# Patient Record
Sex: Female | Born: 1950
Health system: Southern US, Community
[De-identification: ages and names within clinical notes are randomized; demographics above are authoritative.]

## PROBLEM LIST (undated history)

## (undated) DIAGNOSIS — E079 Disorder of thyroid, unspecified: Secondary | ICD-10-CM

## (undated) DIAGNOSIS — F17200 Nicotine dependence, unspecified, uncomplicated: Secondary | ICD-10-CM

## (undated) DIAGNOSIS — I10 Essential (primary) hypertension: Secondary | ICD-10-CM

## (undated) DIAGNOSIS — E781 Pure hyperglyceridemia: Secondary | ICD-10-CM

## (undated) DIAGNOSIS — M1990 Unspecified osteoarthritis, unspecified site: Secondary | ICD-10-CM

## (undated) DIAGNOSIS — R7301 Impaired fasting glucose: Secondary | ICD-10-CM

## (undated) DIAGNOSIS — F419 Anxiety disorder, unspecified: Secondary | ICD-10-CM

## (undated) DIAGNOSIS — E559 Vitamin D deficiency, unspecified: Secondary | ICD-10-CM

## (undated) DIAGNOSIS — E785 Hyperlipidemia, unspecified: Secondary | ICD-10-CM

## (undated) HISTORY — PX: TUBAL LIGATION: SHX77

## (undated) HISTORY — PX: CARPAL TUNNEL RELEASE: SHX101

## (undated) HISTORY — DX: Hyperlipidemia, unspecified: E78.5

## (undated) HISTORY — DX: Unspecified osteoarthritis, unspecified site: M19.90

## (undated) HISTORY — DX: Vitamin D deficiency, unspecified: E55.9

## (undated) HISTORY — DX: Pure hyperglyceridemia: E78.1

## (undated) HISTORY — DX: Essential (primary) hypertension: I10

## (undated) HISTORY — DX: Anxiety disorder, unspecified: F41.9

## (undated) HISTORY — DX: Impaired fasting glucose: R73.01

## (undated) HISTORY — DX: Disorder of thyroid, unspecified: E07.9

## (undated) HISTORY — DX: Nicotine dependence, unspecified, uncomplicated: F17.200

---

## 2012-02-22 ENCOUNTER — Ambulatory Visit: Payer: Self-pay

## 2012-02-22 LAB — HM PAP SMEAR

## 2012-08-30 ENCOUNTER — Ambulatory Visit: Payer: Self-pay

## 2012-08-30 LAB — URINALYSIS, COMPLETE
Bilirubin,UR: NEGATIVE
Glucose,UR: NEGATIVE mg/dL (ref 0–75)
Leukocyte Esterase: NEGATIVE
Nitrite: NEGATIVE

## 2014-05-10 ENCOUNTER — Ambulatory Visit: Payer: Self-pay | Admitting: Family Medicine

## 2015-05-18 DIAGNOSIS — E039 Hypothyroidism, unspecified: Secondary | ICD-10-CM

## 2015-05-18 DIAGNOSIS — E063 Autoimmune thyroiditis: Secondary | ICD-10-CM | POA: Insufficient documentation

## 2015-05-18 DIAGNOSIS — M1990 Unspecified osteoarthritis, unspecified site: Secondary | ICD-10-CM | POA: Insufficient documentation

## 2015-05-18 DIAGNOSIS — I10 Essential (primary) hypertension: Secondary | ICD-10-CM | POA: Insufficient documentation

## 2015-05-18 DIAGNOSIS — E559 Vitamin D deficiency, unspecified: Secondary | ICD-10-CM

## 2015-05-18 DIAGNOSIS — E781 Pure hyperglyceridemia: Secondary | ICD-10-CM

## 2015-05-18 DIAGNOSIS — F419 Anxiety disorder, unspecified: Secondary | ICD-10-CM

## 2015-05-18 DIAGNOSIS — M19049 Primary osteoarthritis, unspecified hand: Secondary | ICD-10-CM

## 2015-05-18 DIAGNOSIS — E785 Hyperlipidemia, unspecified: Secondary | ICD-10-CM | POA: Insufficient documentation

## 2015-05-18 DIAGNOSIS — R7301 Impaired fasting glucose: Secondary | ICD-10-CM | POA: Insufficient documentation

## 2015-05-18 DIAGNOSIS — F17219 Nicotine dependence, cigarettes, with unspecified nicotine-induced disorders: Secondary | ICD-10-CM | POA: Insufficient documentation

## 2015-05-18 DIAGNOSIS — Z9189 Other specified personal risk factors, not elsewhere classified: Secondary | ICD-10-CM | POA: Insufficient documentation

## 2015-05-18 DIAGNOSIS — F17200 Nicotine dependence, unspecified, uncomplicated: Secondary | ICD-10-CM

## 2015-05-27 ENCOUNTER — Encounter: Payer: Self-pay | Admitting: Unknown Physician Specialty

## 2015-05-27 ENCOUNTER — Ambulatory Visit (INDEPENDENT_AMBULATORY_CARE_PROVIDER_SITE_OTHER): Payer: PRIVATE HEALTH INSURANCE | Admitting: Unknown Physician Specialty

## 2015-05-27 VITALS — BP 132/83 | HR 71 | Temp 98.5°F | Ht <= 58 in | Wt 118.8 lb

## 2015-05-27 DIAGNOSIS — I10 Essential (primary) hypertension: Secondary | ICD-10-CM

## 2015-05-27 DIAGNOSIS — F17200 Nicotine dependence, unspecified, uncomplicated: Secondary | ICD-10-CM

## 2015-05-27 DIAGNOSIS — E785 Hyperlipidemia, unspecified: Secondary | ICD-10-CM | POA: Diagnosis not present

## 2015-05-27 DIAGNOSIS — E039 Hypothyroidism, unspecified: Secondary | ICD-10-CM

## 2015-05-27 DIAGNOSIS — Z0000 Encounter for general adult medical examination without abnormal findings: Secondary | ICD-10-CM

## 2015-05-27 DIAGNOSIS — R7301 Impaired fasting glucose: Secondary | ICD-10-CM

## 2015-05-27 LAB — MICROALBUMIN, URINE WAIVED
Creatinine, Urine Waived: 50 mg/dL (ref 10–300)
Microalb, Ur Waived: 10 mg/L (ref 0–19)
Microalb/Creat Ratio: <30 mg/g (ref <30)

## 2015-05-27 LAB — BAYER DCA HB A1C WAIVED: HB A1C (BAYER DCA - WAIVED): 5.9 % (ref <7.0)

## 2015-05-27 NOTE — Progress Notes (Signed)
BP 132/83 mmHg  Pulse 71  Temp(Src) 98.5 F (36.9 C)  Ht 4' 9.2" (1.453 m)  Wt 118 lb 12.8 oz (53.887 kg)  BMI 25.52 kg/m2  SpO2 95%  LMP  (LMP Unknown)   Subjective:    Patient ID: Savannah Whitaker, female    DOB: 05-07-1951, 64 y.o.   MRN: [id number]  HPI: Savannah CottaDonna Florence Recupero is a 64 y.o. female  Chief Complaint  Patient presents with  . Annual Exam   Hypertension This is a chronic problem. The problem is controlled. Pertinent negatives include no anxiety, blurred vision, chest pain, headaches, malaise/fatigue, neck pain, orthopnea, palpitations, peripheral edema, PND, shortness of breath or sweats. The current treatment provides significant improvement. There are no compliance problems.   Hyperlipidemia Chronicity: Not taking cholesterol medication as she didn't like the way it made her feel with soreness and leg pain.   Pertinent negatives include no chest pain, focal sensory loss, focal weakness, leg pain, myalgias or shortness of breath.    Relevant past medical, surgical, family and social history reviewed and updated as indicated. Interim medical history since our last visit reviewed. Allergies and medications reviewed and updated.  Review of Systems  Constitutional: Negative for malaise/fatigue.  Eyes: Negative for blurred vision.  Respiratory: Negative for shortness of breath.   Cardiovascular: Negative for chest pain, palpitations, orthopnea and PND.  Musculoskeletal: Negative for myalgias and neck pain.  Neurological: Negative for focal weakness and headaches.    Per HPI unless specifically indicated above     Objective:    BP 132/83 mmHg  Pulse 71  Temp(Src) 98.5 F (36.9 C)  Ht 4' 9.2" (1.453 m)  Wt 118 lb 12.8 oz (53.887 kg)  BMI 25.52 kg/m2  SpO2 95%  LMP  (LMP Unknown)  Wt Readings from Last 3 Encounters:  05/27/15 118 lb 12.8 oz (53.887 kg)  12/02/14 121 lb (54.885 kg)    Physical Exam  Constitutional: She is oriented to person,  place, and time. She appears well-developed and well-nourished.  HENT:  Head: Normocephalic and atraumatic.  Eyes: Pupils are equal, round, and reactive to light. Right eye exhibits no discharge. Left eye exhibits no discharge. No scleral icterus.  Neck: Normal range of motion. Neck supple. Carotid bruit is not present. No thyromegaly present.  Cardiovascular: Normal rate, regular rhythm and normal heart sounds.  Exam reveals no gallop and no friction rub.   No murmur heard. Pulmonary/Chest: Effort normal and breath sounds normal. No respiratory distress. She has no wheezes. She has no rales.  Abdominal: Soft. Bowel sounds are normal. There is no tenderness. There is no rebound.  Genitourinary: Vagina normal and uterus normal. No breast swelling, tenderness or discharge. There is no rash, tenderness or lesion on the right labia. There is no rash, tenderness or lesion on the left labia. Cervix exhibits no motion tenderness and no friability. Right adnexum displays no mass, no tenderness and no fullness. Left adnexum displays no mass, no tenderness and no fullness.  Musculoskeletal: Normal range of motion.  Lymphadenopathy:    She has no cervical adenopathy.  Neurological: She is alert and oriented to person, place, and time.  Skin: Skin is warm, dry and intact. No rash noted.  Psychiatric: She has a normal mood and affect. Her speech is normal and behavior is normal. Judgment and thought content normal. Cognition and memory are normal.    Results for orders placed or performed in visit on 05/18/15  HM PAP SMEAR  Result Value  Ref Range   HM Pap smear from PP       Assessment & Plan:   Problem List Items Addressed This Visit      Unprioritized   Hyperlipidemia - Primary   Relevant Orders   Lipid Panel w/o Chol/HDL Ratio   Hypothyroidism   Relevant Orders   TSH   Hypertension   Relevant Orders   Uric acid   Microalbumin, Urine Waived   IFG (impaired fasting glucose)   Relevant  Orders   Bayer DCA Hb A1c Waived   Tobacco use disorder    Other Visit Diagnoses    Routine general medical examination at a health care facility        Relevant Orders    Hepatitis C antibody    HIV antibody    Comprehensive metabolic panel    CBC with Differential/Platelet    IGP, Aptima HPV, rfx 16/18,45    TSH       Diagnosis stable.  Continue present meds.  Will talk about cholesterol after results come back.    Follow up plan: No Follow-up on file.

## 2015-05-28 LAB — LIPID PANEL W/O CHOL/HDL RATIO
Cholesterol, Total: 261 mg/dL — ABNORMAL HIGH (ref 100–199)
HDL: 32 mg/dL — ABNORMAL LOW (ref >39)
Triglycerides: 500 mg/dL — ABNORMAL HIGH (ref 0–149)

## 2015-05-28 LAB — COMPREHENSIVE METABOLIC PANEL
A/G RATIO: 1.6 (ref 1.1–2.5)
ALBUMIN: 4.2 g/dL (ref 3.6–4.8)
ALT: 13 IU/L (ref 0–32)
AST: 12 IU/L (ref 0–40)
Alkaline Phosphatase: 52 IU/L (ref 39–117)
BUN / CREAT RATIO: 23 (ref 11–26)
BUN: 14 mg/dL (ref 8–27)
Bilirubin Total: 0.4 mg/dL (ref 0.0–1.2)
CALCIUM: 9.3 mg/dL (ref 8.7–10.3)
CO2: 24 mmol/L (ref 18–29)
CREATININE: 0.61 mg/dL (ref 0.57–1.00)
Chloride: 101 mmol/L (ref 97–106)
GFR, EST AFRICAN AMERICAN: 112 mL/min/{1.73_m2} (ref >59)
GFR, EST NON AFRICAN AMERICAN: 97 mL/min/{1.73_m2} (ref >59)
GLOBULIN, TOTAL: 2.6 g/dL (ref 1.5–4.5)
Glucose: 92 mg/dL (ref 65–99)
POTASSIUM: 4.1 mmol/L (ref 3.5–5.2)
SODIUM: 141 mmol/L (ref 136–144)
TOTAL PROTEIN: 6.8 g/dL (ref 6.0–8.5)

## 2015-05-28 LAB — CBC WITH DIFFERENTIAL/PLATELET
Basophils Absolute: 0.1 10*3/uL (ref 0.0–0.2)
Basos: 1 %
EOS (ABSOLUTE): 0.3 10*3/uL (ref 0.0–0.4)
EOS: 3 %
Hematocrit: 38.6 % (ref 34.0–46.6)
Hemoglobin: 12.9 g/dL (ref 11.1–15.9)
Immature Grans (Abs): 0 10*3/uL (ref 0.0–0.1)
Immature Granulocytes: 0 %
LYMPHS ABS: 2.4 10*3/uL (ref 0.7–3.1)
Lymphs: 23 %
MCH: 30 pg (ref 26.6–33.0)
MCHC: 33.4 g/dL (ref 31.5–35.7)
MCV: 90 fL (ref 79–97)
MONOS ABS: 0.6 10*3/uL (ref 0.1–0.9)
Monocytes: 5 %
NEUTROS ABS: 7 10*3/uL (ref 1.4–7.0)
Neutrophils: 68 %
PLATELETS: 258 10*3/uL (ref 150–379)
RBC: 4.3 x10E6/uL (ref 3.77–5.28)
RDW: 14.3 % (ref 12.3–15.4)
WBC: 10.3 10*3/uL (ref 3.4–10.8)

## 2015-05-28 LAB — URIC ACID: URIC ACID: 5.3 mg/dL (ref 2.5–7.1)

## 2015-05-28 LAB — HIV ANTIBODY (ROUTINE TESTING W REFLEX): HIV Screen 4th Generation wRfx: NONREACTIVE

## 2015-05-28 LAB — HEPATITIS C ANTIBODY

## 2015-05-28 LAB — TSH: TSH: 0.362 u[IU]/mL — AB (ref 0.450–4.500)

## 2015-05-29 ENCOUNTER — Telehealth: Payer: Self-pay | Admitting: Unknown Physician Specialty

## 2015-05-29 ENCOUNTER — Encounter: Payer: Self-pay | Admitting: Unknown Physician Specialty

## 2015-05-29 LAB — IGP, APTIMA HPV, RFX 16/18,45: PAP Smear Comment: 0

## 2015-05-29 NOTE — Progress Notes (Signed)
Quick Note:  High Triglycerides. Patient notified by letter and phone call. Recheck in 3 months ______

## 2015-05-29 NOTE — Telephone Encounter (Signed)
Phone call to discuss elevated Triglycerides.  Letter sent.  Will recheck in 3 months

## 2015-07-08 ENCOUNTER — Emergency Department
Admission: EM | Admit: 2015-07-08 | Discharge: 2015-07-08 | Disposition: A | Discharge status: Home / Self Care | Payer: PRIVATE HEALTH INSURANCE | Attending: Emergency Medicine | Admitting: Emergency Medicine

## 2015-07-08 ENCOUNTER — Emergency Department: Payer: PRIVATE HEALTH INSURANCE

## 2015-07-08 DIAGNOSIS — R251 Tremor, unspecified: Secondary | ICD-10-CM | POA: Diagnosis not present

## 2015-07-08 DIAGNOSIS — R002 Palpitations: Secondary | ICD-10-CM | POA: Diagnosis present

## 2015-07-08 DIAGNOSIS — I10 Essential (primary) hypertension: Secondary | ICD-10-CM | POA: Diagnosis not present

## 2015-07-08 DIAGNOSIS — Z79899 Other long term (current) drug therapy: Secondary | ICD-10-CM | POA: Insufficient documentation

## 2015-07-08 DIAGNOSIS — F17210 Nicotine dependence, cigarettes, uncomplicated: Secondary | ICD-10-CM | POA: Diagnosis not present

## 2015-07-08 LAB — CBC
HCT: 37.2 % (ref 35.0–47.0)
Hemoglobin: 12.7 g/dL (ref 12.0–16.0)
MCH: 30.4 pg (ref 26.0–34.0)
MCHC: 34.1 g/dL (ref 32.0–36.0)
MCV: 89.2 fL (ref 80.0–100.0)
PLATELETS: 262 10*3/uL (ref 150–440)
RBC: 4.17 MIL/uL (ref 3.80–5.20)
RDW: 13.8 % (ref 11.5–14.5)
WBC: 11.9 10*3/uL — ABNORMAL HIGH (ref 3.6–11.0)

## 2015-07-08 LAB — BASIC METABOLIC PANEL
Anion gap: 7 (ref 5–15)
BUN: 11 mg/dL (ref 6–20)
CHLORIDE: 110 mmol/L (ref 101–111)
CO2: 24 mmol/L (ref 22–32)
CREATININE: 0.62 mg/dL (ref 0.44–1.00)
Calcium: 9.2 mg/dL (ref 8.9–10.3)
GFR calc Af Amer: >60 mL/min (ref >60)
GFR calc non Af Amer: >60 mL/min (ref >60)
Glucose, Bld: 112 mg/dL — ABNORMAL HIGH (ref 65–99)
Potassium: 3.6 mmol/L (ref 3.5–5.1)
SODIUM: 141 mmol/L (ref 135–145)

## 2015-07-08 LAB — TROPONIN I: Troponin I: <0.03 ng/mL (ref <0.031)

## 2015-07-08 LAB — TSH: TSH: 2.018 u[IU]/mL (ref 0.350–4.500)

## 2015-07-08 LAB — MAGNESIUM: Magnesium: 2 mg/dL (ref 1.7–2.4)

## 2015-07-08 NOTE — ED Notes (Signed)
Pt dc home ambulatory denies pain instructed on follow up plan no new RX provided

## 2015-07-08 NOTE — ED Provider Notes (Signed)
[facility] Emergency Department Provider Note  ____________________________________________  Time seen: Approximately 325 AM  I have reviewed the triage vital signs and the nursing notes.   HISTORY  Chief Complaint No chief complaint on file.    HPI Savannah Whitaker is a 64 y.o. female comes into the department today with palpitations. The patient reports that last night she got off work at 10 PM ate some cereal only down to watch television. The patient reports that she started feeling her heart racing. The patient reports that she got the blood pressure machine and her blood pressure was 211. The patient reports that she didn't feel right so she called 911. They checked her heart rate her oxygen and her blood pressure which was 180/104. The patient reports that by the time EMS left blood pressure had improved. The patient reports that she did not want to come by ambulance but decided to come in for evaluation. The patient never had any chest pain and the palpitations improved after a short while. The patient reports though that her head felt a little heavy and she was shaking. The patient has had panic attacks in the past with his never had symptoms like this before. The patient takes thyroid medication and drinks a lot of caffeine. The patient reports that a coworker brought in some tea today and she did drink some of his energy tea. According to her husband that is the only difference that has occurred today.   Past Medical History  Diagnosis Date  . Hyperlipidemia   . Thyroid disease   . Anxiety   . Hypertension   . IFG (impaired fasting glucose)   . Tobacco use disorder   . Vitamin D deficiency   . Osteoarthritis   . Hypertriglyceridemia     Patient Active Problem List   Diagnosis Date Noted  . Hyperlipidemia 05/18/2015  . Hypothyroidism 05/18/2015  . Acute anxiety 05/18/2015  . Hypertension 05/18/2015  . IFG (impaired fasting glucose)  05/18/2015  . Tobacco use disorder 05/18/2015  . Vitamin D deficiency 05/18/2015  . Osteoarthritis 05/18/2015  . Hypertriglyceridemia 05/18/2015    Past Surgical History  Procedure Laterality Date  . Tubal ligation    . Carpal tunnel release Right     Current Outpatient Rx  Name  Route  Sig  Dispense  Refill  . atorvastatin (LIPITOR) 40 MG tablet   Oral   Take 40 mg by mouth daily.         Marland Kitchen. levothyroxine (SYNTHROID, LEVOTHROID) 75 MCG tablet   Oral   Take 75 mcg by mouth daily before breakfast.         . metoprolol succinate (TOPROL-XL) 50 MG 24 hr tablet   Oral   Take 50 mg by mouth daily. Take with or immediately following a meal.           Allergies Review of patient's allergies indicates no known allergies.  Family History  Problem Relation Age of Onset  . Cancer Mother     lung  . Heart disease Father   . Cancer Brother     colon  . Alcohol abuse Brother   . Cancer Brother     mouth    Social History Social History  Substance Use Topics  . Smoking status: Current Every Day Smoker -- 1.00 packs/day    Types: Cigarettes  . Smokeless tobacco: Never Used  . Alcohol Use: No    Review of Systems Constitutional: No fever/chills Eyes: No visual  changes. ENT: No sore throat. Cardiovascular:  Palpitations Respiratory: Denies shortness of breath. Gastrointestinal: No abdominal pain.  No nausea, no vomiting.  No diarrhea.  No constipation. Genitourinary: Negative for dysuria. Musculoskeletal: Negative for back pain. Skin: Negative for rash. Neurological: Negative for headaches, focal weakness or numbness.  10-point ROS otherwise negative.  ____________________________________________   PHYSICAL EXAM:  VITAL SIGNS: ED Triage Vitals  Enc Vitals Group     BP 07/08/15 0205 146/77 mmHg     Pulse Rate 07/08/15 0205 84     Resp 07/08/15 0205 18     Temp 07/08/15 0205 97.9 F (36.6 C)     Temp Source 07/08/15 0205 Oral     SpO2 07/08/15 0205 98  %     Weight 07/08/15 0205 116 lb (52.617 kg)     Height 07/08/15 0205  (1.448 m)     Head Cir --      Peak Flow --      Pain Score --      Pain Loc --      Pain Edu? --      Excl. in GC? --     Constitutional: Alert and oriented. Well appearing and in no acute distress. Eyes: Conjunctivae are normal. PERRL. EOMI. Head: Atraumatic. Nose: No congestion/rhinnorhea. Mouth/Throat: Mucous membranes are moist.  Oropharynx non-erythematous. Cardiovascular: Normal rate, regular rhythm. Grossly normal heart sounds.  Good peripheral circulation. Respiratory: Normal respiratory effort.  No retractions. Lungs CTAB. Gastrointestinal: Soft and nontender. No distention.  Positive bowel sounds Musculoskeletal: No lower extremity tenderness nor edema.   Neurologic:  Normal speech and language. No gross focal neurologic deficits are appreciated.  Skin:  Skin is warm, dry and intact. Psychiatric: Mood and affect are normal.   ____________________________________________   LABS (all labs ordered are listed, but only abnormal results are displayed)  Labs Reviewed  BASIC METABOLIC PANEL - Abnormal; Notable for the following:    Glucose, Bld 112 (*)    All other components within normal limits  CBC - Abnormal; Notable for the following:    WBC 11.9 (*)    All other components within normal limits  TROPONIN I  TSH  MAGNESIUM   ____________________________________________  EKG  ED ECG REPORT I, Rebecka Apley, the attending physician, personally viewed and interpreted this ECG.   Date: 07/08/2015  EKG Time: 207  Rate: 83  Rhythm: normal sinus rhythm  Axis: normal  Intervals:none  ST&T Change: flipped t wave lead V3  ____________________________________________  RADIOLOGY  CXR: Mild peribronchial thickening noted, mildly increased interstitial markings are nonspecific, lungs otherwise grossly  clear. ____________________________________________   PROCEDURES  Procedure(s) performed: None  Critical Care performed: No  ____________________________________________   INITIAL IMPRESSION / ASSESSMENT AND PLAN / ED COURSE  Pertinent labs & imaging results that were available during my care of the patient were reviewed by me and considered in my medical decision making (see chart for details).  This is a 64 year old female who comes into the hospital today with palpitations. The patient's blood work is unremarkable including a TSH and magnesium. The palpitations could be due to the increased caffeine intake the patient had today or even her typical caffeine intake. I instructed the patient cut down on her caffeine intake and follow-up with her primary care physician for Holter monitor to evaluate the rhythm the patient may have developed with the palpitations. Otherwise the patient has no complaints or concerns she's not having anymore symptoms and her blood pressures improved. The  patient be discharged home to follow-up with her primary care physician. ____________________________________________   FINAL CLINICAL IMPRESSION(S) / ED DIAGNOSES  Final diagnoses:  Palpitations      Rebecka Apley, MD 07/08/15 [phone]

## 2015-07-08 NOTE — Discharge Instructions (Signed)

## 2015-07-08 NOTE — ED Notes (Addendum)
Patient ambulatory to triage with steady gait, without difficulty or distress noted, smells heavily of cigarette smoke; pt reports while watching TV, had onset sensation heart racing and no accomp symptoms; denies hx of same; took 4-81mg  ASA PTA; denies c/o at present

## 2015-07-22 ENCOUNTER — Other Ambulatory Visit: Payer: Self-pay | Admitting: Unknown Physician Specialty

## 2015-07-29 ENCOUNTER — Ambulatory Visit (INDEPENDENT_AMBULATORY_CARE_PROVIDER_SITE_OTHER): Payer: PRIVATE HEALTH INSURANCE | Admitting: Family Medicine

## 2015-07-29 ENCOUNTER — Encounter: Payer: Self-pay | Admitting: Family Medicine

## 2015-07-29 VITALS — BP 147/92 | HR 72 | Temp 97.6°F | Wt 116.0 lb

## 2015-07-29 DIAGNOSIS — D72829 Elevated white blood cell count, unspecified: Secondary | ICD-10-CM

## 2015-07-29 DIAGNOSIS — F17200 Nicotine dependence, unspecified, uncomplicated: Secondary | ICD-10-CM | POA: Diagnosis not present

## 2015-07-29 DIAGNOSIS — I10 Essential (primary) hypertension: Secondary | ICD-10-CM

## 2015-07-29 DIAGNOSIS — R000 Tachycardia, unspecified: Secondary | ICD-10-CM | POA: Diagnosis not present

## 2015-07-29 DIAGNOSIS — E049 Nontoxic goiter, unspecified: Secondary | ICD-10-CM | POA: Diagnosis not present

## 2015-07-29 DIAGNOSIS — R002 Palpitations: Secondary | ICD-10-CM

## 2015-07-29 DIAGNOSIS — L530 Toxic erythema: Secondary | ICD-10-CM

## 2015-07-29 DIAGNOSIS — F419 Anxiety disorder, unspecified: Secondary | ICD-10-CM

## 2015-07-29 DIAGNOSIS — E039 Hypothyroidism, unspecified: Secondary | ICD-10-CM | POA: Diagnosis not present

## 2015-07-29 DIAGNOSIS — L538 Other specified erythematous conditions: Secondary | ICD-10-CM | POA: Insufficient documentation

## 2015-07-29 MED ORDER — AMLODIPINE BESYLATE 2.5 MG PO TABS: 2.5000 mg | ORAL_TABLET | Freq: Every day | ORAL | Status: DC

## 2015-07-29 NOTE — Progress Notes (Signed)
BP 147/92 mmHg  Pulse 72  Temp(Src) 97.6 F (36.4 C)  Wt 116 lb (52.617 kg)  SpO2 98%  LMP  (LMP Unknown)   Subjective:    Patient ID: Savannah Whitaker, female    DOB: 1951-07-08, 64 y.o.   MRN: [id number]  HPI: Savannah Whitaker is a 64 y.o. female  Chief Complaint  Patient presents with  . Hypertension    She had an episode on Dec 7th and called EMS. Her pulse was 212. Her BP was really high that day. She went to the ER and they checked out everything and found nothing. She had another episode on Christmas Eve and 3 episodes yesterday. 172/98 was one of her BP readings yesterday.  Patient is here with her husband for an acute visit; her primary is not available, so this is my first time meeting Savannah Whitaker  She is having a problem with her heart racing and her BP going up; she called EMS, they did labs and EKG and could not find anything wrong with her heart She has had a few episodes since then;  When she feels it, starts panicking and that might make it worse; checking BP frequently; varies up and down; this morning it was 149 systolic; wonders about dehydration Nothing really any different, no increase in stress except that her son lost his job Afraid to do much; thinking it is going to come back First episode was "ridiculous", "real bad" says her husbnad; she came home from work and was shaking; she was breaking out in a cold sweat; called 911; took 4 baby aspirin; she had calmed down when EMS got there; did EKG then and then to the ER and more tests Three of these episodes yesterday; getting more frequent Bottom number 92 to 98 mmHg Heart rate was 72 yesterday; when EMS got there, her heart rate was 211 her husband says but the ER record says the BP was 211; EMS BP was 180/104 Never had any chest pain with these episodes; no shortness of breath; just feeling panicky  She smokes cigarettes, but when I asked if she is ready to quit, she says that she is thinking seriously  about quitting smoking  On thyroid medicine, same dose as before; she thinks it's the same pills but she will go home and check; I wanted to make sure she did not accidentally get a higher dose from pharmacy and she might actually be over-replaced, causing her symptoms; she has been on metoprolol and that looks the same too; she does not think that dose is accidentally lower or different  She drinks a lot of caffeine  Hx of panic attacks in 1993 and 1994; has had several panic attacks on her way to [place]  Reviewed recent BMP, K+ 3.6 WBC 11.9k, not sick Trop I <0.03 TSH 2.018, but two months ago her TSH 2 months 0.362 No swelling in the front of the neck; just a little bump on the left side of the neck; primary just keeping a check on it Her cholesterol was really high in October; had plans to go back on the statin but not done yet, sitting there at home ---------------------------------- ER records reviewed...  ED ECG REPORT I, Rebecka Apley, the attending physician, personally viewed and interpreted this ECG. Date: 07/08/2015 EKG Time: 207 Rate: 83 Rhythm: normal sinus rhythm Axis: normal Intervals:none ST&T Change: flipped t wave lead V3 ____________________________________________  RADIOLOGY CXR: Mild peribronchial thickening noted, mildly increased interstitial markings are  nonspecific, lungs otherwise grossly clear. Relevant past medical, surgical, family and social history reviewed and updated as indicated. Interim medical history since our last visit reviewed.  Allergies and medications reviewed and updated.  Review of Systems Per HPI unless specifically indicated above     Objective:    BP 147/92 mmHg  Pulse 72  Temp(Src) 97.6 F (36.4 C)  Wt 116 lb (52.617 kg)  SpO2 98%  LMP  (LMP Unknown)  Wt Readings from Last 3 Encounters:  07/29/15 116 lb (52.617 kg)  07/08/15 116 lb (52.617 kg)  05/27/15 118 lb 12.8 oz (53.887 kg)    Physical Exam   Constitutional: She appears well-developed and well-nourished. No distress.  Appears a little older than stated age  Neck: Thyroid mass (right side visibly larger than left; nontender) present.  Cardiovascular: Normal rate and regular rhythm.   No extrasystoles are present.  Pulmonary/Chest: Effort normal and breath sounds normal. No respiratory distress. She has no wheezes.  Abdominal: Soft. She exhibits no abdominal bruit and no pulsatile midline mass. There is no tenderness.  Musculoskeletal: She exhibits no edema.  Neurological: She is alert. She displays no tremor.  Reflex Scores:      Patellar reflexes are 2+ on the right side and 2+ on the left side. Skin: Skin is warm and dry. She is not diaphoretic. No erythema. No pallor.  Palmar erythema bilaterally  Psychiatric: Her mood appears anxious.      Assessment & Plan:   Problem List Items Addressed This Visit      Cardiovascular and Mediastinum   Hypertension    Add CCB, continue beta-blocker, try the DASH guidelines; limit caffeine, decongestants      Relevant Medications   amLODipine (NORVASC) 2.5 MG tablet     Endocrine   Hypothyroidism    Patient to make sure pills are the same as last month; check free T3, T4, TSH      Relevant Orders   TSH (Completed)   T3, free (Completed)   T4, free (Completed)   Thyroid enlargement    Ultrasound of neck and labs; patient to make sure dose of thyroid medicine is same as last month per pills per bottle      Relevant Orders   US Soft Tissue Head/Neck   TSH (Completed)   T3, free (Completed)   T4, free (Completed)     Musculoskeletal and Integument   Palmar erythema    Consider B12 deficiency        Other   Acute anxiety    Not sure if purely psychiatric or she could have iatrogenic hyperthyroidism or hot nodule (right side visibly larger on exam); ddx also includes cardiac dysrrhythmia (r/o delta wave on EKG done in ER), r/o pheo, consider further down in the  work-up      Tobacco use disorder    Encouraged smoking cessation, counseling # and tips given      Tachycardia - Primary    Evaluated in the ER already; ddx includes hyperthyroidism, rebound from beta-blocker decrease so patient will check her bottles at home and make sure her thyroid medicine and beta-blocker look exactly the same as they have in months prior; also the possibility of delta wave on EKG, reviewed personally by me (the 07/08/15 ER EKG); very rare, though; pheo is also in ddx, again further down the list of possibility; refer to cardiologist; close f/u with primary      Relevant Orders   Ambulatory referral to Cardiology  Other Visit Diagnoses    Palpitations        Relevant Orders    Ambulatory referral to Cardiology    Basic metabolic panel (Completed)    Leukocytosis        Relevant Orders    CBC with Differential/Platelet (Completed)        Follow up plan: Return in about 2 weeks (around 08/12/2015) [facility]  Orders Placed This Encounter  Procedures  . US Soft Tissue Head/Neck  . CBC with Differential/Platelet  . Basic metabolic panel  . TSH  . T3, free  . T4, free  . Ambulatory referral to Cardiology   An after-visit summary was printed and given to the patient at check-out.  Please see the patient instructions which may contain other information and recommendations beyond what is mentioned above in the assessment and plan.  Face-to-face time with patient was more than 25 minutes, >50% time spent counseling and coordination of care

## 2015-07-29 NOTE — Assessment & Plan Note (Addendum)
Add CCB, continue beta-blocker, try the DASH guidelines; limit caffeine, decongestants

## 2015-07-29 NOTE — Assessment & Plan Note (Signed)
Patient to make sure pills are the same as last month; check free T3, T4, TSH

## 2015-07-29 NOTE — Assessment & Plan Note (Signed)
Ultrasound of neck and labs; patient to make sure dose of thyroid medicine is same as last month per pills per bottle

## 2015-07-29 NOTE — Assessment & Plan Note (Signed)
Encouraged smoking cessation, counseling # and tips given

## 2015-07-29 NOTE — Patient Instructions (Addendum)
Please call [phone] if you would like smoking cessation classes  Your goal blood pressure is less than 140 mmHg on top. Try to follow the DASH guidelines (DASH stands for Dietary Approaches to Stop Hypertension) Try to limit the sodium in your diet.  Ideally, consume less than 1.5 grams (less than 1,500mg ) per day. Do not add salt when cooking or at the table.  Check the sodium amount on labels when shopping, and choose items lower in sodium when given a choice. Avoid or limit foods that already contain a lot of sodium. Eat a diet rich in fruits and vegetables and whole grains.  Try to use PLAIN allergy medicine without the decongestant Avoid: phenylephrine, phenylpropanolamine, and pseudoephredine  Continue low dose aspirin 81 mg coated  We'll get you to a cardiologist  We'll get labs and scan of thyroid  If you have not heard anything from my staff in a week about any orders/referrals/studies from today, please contact us here to follow-up [phone]  Get back on the cholesterol medicine  Return to see [name] in 2 weeks  Cut your caffeine and tea and other stimulant drinks by 50% for the first week, then cut back again another 50% the next week and so on  Call 911 if symptoms return or any chest pain

## 2015-07-30 LAB — CBC WITH DIFFERENTIAL/PLATELET
BASOS ABS: 0.1 10*3/uL (ref 0.0–0.2)
Basos: 0 %
EOS (ABSOLUTE): 0.2 10*3/uL (ref 0.0–0.4)
EOS: 2 %
HEMOGLOBIN: 13.3 g/dL (ref 11.1–15.9)
Hematocrit: 39.4 % (ref 34.0–46.6)
IMMATURE GRANS (ABS): 0 10*3/uL (ref 0.0–0.1)
Immature Granulocytes: 0 %
LYMPHS: 23 %
Lymphocytes Absolute: 2.5 10*3/uL (ref 0.7–3.1)
MCH: 30.2 pg (ref 26.6–33.0)
MCHC: 33.8 g/dL (ref 31.5–35.7)
MCV: 90 fL (ref 79–97)
MONOCYTES: 5 %
Monocytes Absolute: 0.6 10*3/uL (ref 0.1–0.9)
NEUTROS ABS: 7.8 10*3/uL — AB (ref 1.4–7.0)
Neutrophils: 70 %
Platelets: 268 10*3/uL (ref 150–379)
RBC: 4.4 x10E6/uL (ref 3.77–5.28)
RDW: 13.7 % (ref 12.3–15.4)
WBC: 11.2 10*3/uL — ABNORMAL HIGH (ref 3.4–10.8)

## 2015-07-30 LAB — BASIC METABOLIC PANEL
BUN/Creatinine Ratio: 15 (ref 11–26)
BUN: 9 mg/dL (ref 8–27)
CO2: 23 mmol/L (ref 18–29)
CREATININE: 0.61 mg/dL (ref 0.57–1.00)
Calcium: 9.1 mg/dL (ref 8.7–10.3)
Chloride: 103 mmol/L (ref 96–106)
GFR calc Af Amer: 111 mL/min/{1.73_m2} (ref >59)
GFR calc non Af Amer: 96 mL/min/{1.73_m2} (ref >59)
GLUCOSE: 104 mg/dL — AB (ref 65–99)
Potassium: 4.3 mmol/L (ref 3.5–5.2)
Sodium: 141 mmol/L (ref 134–144)

## 2015-07-30 LAB — TSH: TSH: 0.342 u[IU]/mL — AB (ref 0.450–4.500)

## 2015-07-30 LAB — T3, FREE: T3 FREE: 2.4 pg/mL (ref 2.0–4.4)

## 2015-07-30 LAB — T4, FREE: FREE T4: 1.15 ng/dL (ref 0.82–1.77)

## 2015-08-01 NOTE — Assessment & Plan Note (Signed)
Consider B12 deficiency

## 2015-08-01 NOTE — Assessment & Plan Note (Addendum)
Evaluated in the ER already; ddx includes hyperthyroidism, rebound from beta-blocker decrease so patient will check her bottles at home and make sure her thyroid medicine and beta-blocker look exactly the same as they have in months prior; also the possibility of delta wave on EKG, reviewed personally by me (the 07/08/15 ER EKG); very rare, though; pheo is also in ddx, again further down the list of possibility; refer to cardiologist; close f/u with primary

## 2015-08-01 NOTE — Assessment & Plan Note (Signed)
Not sure if purely psychiatric or she could have iatrogenic hyperthyroidism or hot nodule (right side visibly larger on exam); ddx also includes cardiac dysrrhythmia (r/o delta wave on EKG done in ER), r/o pheo, consider further down in the work-up

## 2015-08-04 ENCOUNTER — Ambulatory Visit
Admission: RE | Admit: 2015-08-04 | Discharge: 2015-08-04 | Disposition: A | Discharge status: Home / Self Care | Payer: PRIVATE HEALTH INSURANCE | Source: Ambulatory Visit | Attending: Family Medicine | Admitting: Family Medicine

## 2015-08-04 DIAGNOSIS — E049 Nontoxic goiter, unspecified: Secondary | ICD-10-CM | POA: Insufficient documentation

## 2015-08-05 ENCOUNTER — Telehealth: Payer: Self-pay | Admitting: Unknown Physician Specialty

## 2015-08-05 ENCOUNTER — Telehealth: Payer: Self-pay | Admitting: Family Medicine

## 2015-08-05 MED ORDER — LORAZEPAM 0.5 MG PO TABS: 0.5000 mg | ORAL_TABLET | Freq: Two times a day (BID) | ORAL | Status: DC | PRN

## 2015-08-05 MED ORDER — LEVOTHYROXINE SODIUM 50 MCG PO TABS: 50.0000 ug | ORAL_TABLET | Freq: Every day | ORAL | Status: DC

## 2015-08-05 NOTE — Telephone Encounter (Signed)
Called pt with results of labs.  Decreased Levothyroxine to 50 mcgs/day.  She is upset that a cardiology appointment is not available until the middle of February as she is concerned and anxious.  She would like to have something for anxiety.  Will rx lorazepam .5 mg for temporary relief of symptoms at night.

## 2015-08-05 NOTE — Telephone Encounter (Signed)
Patient called stating that she came in and had labs done last Wednesday and would like her results. Please call patient to the attached phone number, thanks.

## 2015-08-05 NOTE — Telephone Encounter (Signed)
[name], this patient saw Dr. Sherie DonLada since you were out. She would like her lab results. Would you like to discuss these with her since Dr.Lada is out today?

## 2015-08-05 NOTE — Telephone Encounter (Signed)
Routing to provider. Patient requests to talk to you.

## 2015-08-05 NOTE — Telephone Encounter (Signed)
She thought Ativan would be at the pharmacy. She will need to pick it up.

## 2015-08-05 NOTE — Telephone Encounter (Signed)
Lab results back; WBC elevated but improved; TSH mildly reduced; US to go over with patient too; call her with results

## 2015-08-05 NOTE — Telephone Encounter (Signed)
Patient called stating that [facility]. Please call pt at the following number [phone], thanks.

## 2015-08-12 ENCOUNTER — Ambulatory Visit: Payer: PRIVATE HEALTH INSURANCE | Admitting: Unknown Physician Specialty

## 2015-08-13 NOTE — Telephone Encounter (Signed)
I spoke with patient, and apologized for the delay in getting to her; I saw that Savannah Whitaker had already talked to her about her results Encouraged her to avoid sudafed, limit caffeine

## 2015-08-20 ENCOUNTER — Other Ambulatory Visit: Payer: Self-pay | Admitting: Unknown Physician Specialty

## 2015-08-21 ENCOUNTER — Telehealth: Payer: Self-pay | Admitting: Unknown Physician Specialty

## 2015-08-21 NOTE — Telephone Encounter (Signed)
Spoke to Savannah Whitaker about this patient's medication. She stated that the prescription was just temporary and if she would like to be on it longer, then she would need to come in to be seen. So I called and scheduled her an appointment for 08/24/15.

## 2015-08-21 NOTE — Telephone Encounter (Signed)
Pt called stated her RX for her anxiety medication was denied and she is unclear as to why. Pt stated she is doing very well on this medication. Pt would her medication refilled and would like to know what she needs to do to get this medication refilled. Rx is for Lorazepam. [facility] [place]. Thanks.

## 2015-08-24 ENCOUNTER — Other Ambulatory Visit: Payer: Self-pay | Admitting: Family Medicine

## 2015-08-24 ENCOUNTER — Encounter: Payer: Self-pay | Admitting: Unknown Physician Specialty

## 2015-08-24 ENCOUNTER — Ambulatory Visit (INDEPENDENT_AMBULATORY_CARE_PROVIDER_SITE_OTHER): Payer: PRIVATE HEALTH INSURANCE | Admitting: Unknown Physician Specialty

## 2015-08-24 VITALS — BP 118/74 | HR 79 | Temp 97.7°F | Ht <= 58 in | Wt 116.4 lb

## 2015-08-24 DIAGNOSIS — F419 Anxiety disorder, unspecified: Secondary | ICD-10-CM | POA: Diagnosis not present

## 2015-08-24 MED ORDER — CITALOPRAM HYDROBROMIDE 20 MG PO TABS: 20.0000 mg | ORAL_TABLET | Freq: Every day | ORAL | Status: DC

## 2015-08-24 MED ORDER — LORAZEPAM 0.5 MG PO TABS: 0.5000 mg | ORAL_TABLET | Freq: Two times a day (BID) | ORAL | Status: DC | PRN

## 2015-08-24 NOTE — Progress Notes (Signed)
BP 118/74 mmHg  Pulse 79  Temp(Src) 97.7 F (36.5 C)  Ht 4' 8.8" (1.443 m)  Wt 116 lb 6.4 oz (52.799 kg)  BMI 25.36 kg/m2  SpO2 98%  LMP  (LMP Unknown)   Subjective:    Patient ID: Savannah Whitaker, female    DOB: 01-Jul-1951, 65 y.o.   MRN: [id number]  HPI: Savannah Whitaker is a 65 y.o. female  Chief Complaint  Patient presents with  . Medication Refill    pt would like refill for lorazepam   Pt given a PHQ9 and states that "I don't know how to fill this out."  She states she is worried about her health and afraid to lay down as "that is when I have these spikes in my blood pressure."  States she will wake up and "has a feeling" that her BP has gone up.  She can't identify the feelings.  States her BP "may be" in the 150s.  States she takes a ASA and gets up and does things.  States that helps.  She states that her cardiology appointment is in mid February.   Went to the ER with a negative work-up on 12/7 due to palpitations. She admits to a history of panic attacks.     States that the Lorazepam keeps her in a "calmer state" and takes a 1/2 tablet in the AM and a 1/2 tablet at home "so I can sleep better."  She does not wake up at night if she takes Lorazepam.  Last visit I decreased her Synthroid which seemed to help a little bit.    Relevant past medical, surgical, family and social history reviewed and updated as indicated. Interim medical history since our last visit reviewed. Allergies and medications reviewed and updated.  Review of Systems  Per HPI unless specifically indicated above     Objective:    BP 118/74 mmHg  Pulse 79  Temp(Src) 97.7 F (36.5 C)  Ht 4' 8.8" (1.443 m)  Wt 116 lb 6.4 oz (52.799 kg)  BMI 25.36 kg/m2  SpO2 98%  LMP  (LMP Unknown)  Wt Readings from Last 3 Encounters:  08/24/15 116 lb 6.4 oz (52.799 kg)  07/29/15 116 lb (52.617 kg)  07/08/15 116 lb (52.617 kg)    Physical Exam  Constitutional: She is oriented to person, place,  and time. She appears well-developed and well-nourished. No distress.  HENT:  Head: Normocephalic and atraumatic.  Eyes: Conjunctivae and lids are normal. Right eye exhibits no discharge. Left eye exhibits no discharge. No scleral icterus.  Neck: Normal range of motion. Neck supple. No JVD present. Carotid bruit is not present.  Cardiovascular: Normal rate, regular rhythm and normal heart sounds.   Pulmonary/Chest: Effort normal and breath sounds normal.  Abdominal: Normal appearance. There is no splenomegaly or hepatomegaly.  Musculoskeletal: Normal range of motion.  Neurological: She is alert and oriented to person, place, and time.  Skin: Skin is warm, dry and intact. No rash noted. No pallor.  Psychiatric: She has a normal mood and affect. Her behavior is normal. Judgment and thought content normal.      Assessment & Plan:   Problem List Items Addressed This Visit      Unprioritized   Acute anxiety - Primary    This seems to be a chronic problem at this time.  Will rx Citalopram 20 mg QD.  Will rx Lorazepam until Citalpram has a full effect      Relevant Medications  citalopram (CELEXA) 20 MG tablet   LORazepam (ATIVAN) 0.5 MG tablet       Follow up plan: Return in about 4 weeks (around 09/21/2015).

## 2015-08-24 NOTE — Assessment & Plan Note (Signed)
This seems to be a chronic problem at this time.  Will rx Citalopram 20 mg QD.  Will rx Lorazepam until Citalpram has a full effect

## 2015-09-17 ENCOUNTER — Ambulatory Visit: Payer: PRIVATE HEALTH INSURANCE | Admitting: Cardiovascular Disease

## 2015-09-22 ENCOUNTER — Encounter: Payer: Self-pay | Admitting: Unknown Physician Specialty

## 2015-09-22 ENCOUNTER — Ambulatory Visit (INDEPENDENT_AMBULATORY_CARE_PROVIDER_SITE_OTHER): Payer: PRIVATE HEALTH INSURANCE | Admitting: Unknown Physician Specialty

## 2015-09-22 VITALS — BP 122/78 | HR 66 | Temp 98.3°F | Ht <= 58 in | Wt 114.8 lb

## 2015-09-22 DIAGNOSIS — F419 Anxiety disorder, unspecified: Secondary | ICD-10-CM

## 2015-09-22 DIAGNOSIS — K529 Noninfective gastroenteritis and colitis, unspecified: Secondary | ICD-10-CM | POA: Diagnosis not present

## 2015-09-22 MED ORDER — CITALOPRAM HYDROBROMIDE 20 MG PO TABS: 20.0000 mg | ORAL_TABLET | Freq: Every day | ORAL | Status: DC

## 2015-09-22 NOTE — Assessment & Plan Note (Addendum)
Improved on Citalopram.  Continue present medications

## 2015-09-22 NOTE — Progress Notes (Signed)
   BP 122/78 mmHg  Pulse 66  Temp(Src) 98.3 F (36.8 C)  Ht 4' 9.1" (1.45 m)  Wt 114 lb 12.8 oz (52.073 kg)  BMI 24.77 kg/m2  SpO2 97%  LMP  (LMP Unknown)   Subjective:    Patient ID: Savannah Whitaker, female    DOB: 1950/12/23, 65 y.o.   MRN: [id number]  HPI: Nastassia Bazaldua is a 65 y.o. female  Chief Complaint  Patient presents with  . Anxiety    pt states she has been taking the citalopram but has felt like she did not need to take the lorazepam  . Diarrhea    pt doesn't know if it is because she had been sick or if it is the new medication   Anxiety This is a 4 week f/u after starting the citalopram for anxiety Depression screen [facility] 2/9 09/22/2015  Decreased Interest 0  Down, Depressed, Hopeless 0  PHQ - 2 Score 0  Altered sleeping 1  Tired, decreased energy 1  Change in appetite 1  Feeling bad or failure about yourself  0  Trouble concentrating 0  Moving slowly or fidgety/restless 0  Suicidal thoughts 0  PHQ-9 Score 3    Diarrhea States she has had trouble with stomach for about 4-5 days.  This seems to be getting better.  Some nausea.  She is able to tolerated eating and drinking  Relevant past medical, surgical, family and social history reviewed and updated as indicated. Interim medical history since our last visit reviewed. Allergies and medications reviewed and updated.  Review of Systems  Constitutional: Negative.   HENT: Negative.   Eyes: Negative.   Respiratory: Negative.   Cardiovascular: Negative.   Endocrine: Negative.   Genitourinary: Negative.   Musculoskeletal: Negative.   Skin: Negative.   Allergic/Immunologic: Negative.   Neurological: Negative.   Hematological: Negative.   Psychiatric/Behavioral: Negative.     Per HPI unless specifically indicated above     Objective:    BP 122/78 mmHg  Pulse 66  Temp(Src) 98.3 F (36.8 C)  Ht 4' 9.1" (1.45 m)  Wt 114 lb 12.8 oz (52.073 kg)  BMI 24.77 kg/m2  SpO2 97%  LMP  (LMP  Unknown)  Wt Readings from Last 3 Encounters:  09/22/15 114 lb 12.8 oz (52.073 kg)  08/24/15 116 lb 6.4 oz (52.799 kg)  07/29/15 116 lb (52.617 kg)    Physical Exam  Constitutional: She is oriented to person, place, and time. She appears well-developed and well-nourished. No distress.  HENT:  Head: Normocephalic and atraumatic.  Eyes: Conjunctivae and lids are normal. Right eye exhibits no discharge. Left eye exhibits no discharge. No scleral icterus.  Cardiovascular: Normal rate.   Pulmonary/Chest: Effort normal.  Abdominal: Normal appearance. There is no splenomegaly or hepatomegaly.  Musculoskeletal: Normal range of motion.  Neurological: She is alert and oriented to person, place, and time.  Skin: Skin is intact. No rash noted. No pallor.  Psychiatric: She has a normal mood and affect. Her behavior is normal. Judgment and thought content normal.   Assessment & Plan:   Problem List Items Addressed This Visit      Unprioritized   Chronic anxiety - Primary    Improved on Citalopram.  Continue present medications       Other Visit Diagnoses    Gastroenteritis            Follow up plan: Return in about 6 months (around 03/21/2016).

## 2015-09-29 ENCOUNTER — Other Ambulatory Visit: Payer: Self-pay | Admitting: Unknown Physician Specialty

## 2015-10-05 ENCOUNTER — Ambulatory Visit: Payer: PRIVATE HEALTH INSURANCE | Admitting: Unknown Physician Specialty

## 2015-10-21 ENCOUNTER — Ambulatory Visit: Payer: PRIVATE HEALTH INSURANCE | Admitting: Cardiovascular Disease

## 2015-10-28 ENCOUNTER — Other Ambulatory Visit: Payer: Self-pay | Admitting: Unknown Physician Specialty

## 2015-10-28 NOTE — Telephone Encounter (Signed)
Called and left patient a voicemail asking if she could please return my call.  

## 2015-10-28 NOTE — Telephone Encounter (Signed)
Your patient 

## 2015-10-28 NOTE — Telephone Encounter (Signed)
Needs TSH checked. 

## 2015-10-29 NOTE — Telephone Encounter (Signed)
Called and scheduled patient a lab appointment for 11/04/15. Cheryl-please enter labs you would like drawn.

## 2015-10-30 ENCOUNTER — Other Ambulatory Visit: Payer: Self-pay

## 2015-10-30 DIAGNOSIS — E039 Hypothyroidism, unspecified: Secondary | ICD-10-CM

## 2015-11-04 ENCOUNTER — Other Ambulatory Visit: Payer: PRIVATE HEALTH INSURANCE

## 2015-11-04 DIAGNOSIS — E039 Hypothyroidism, unspecified: Secondary | ICD-10-CM

## 2015-11-05 LAB — TSH: TSH: 8.79 u[IU]/mL — ABNORMAL HIGH (ref 0.450–4.500)

## 2015-11-06 ENCOUNTER — Telehealth: Payer: Self-pay | Admitting: Unknown Physician Specialty

## 2015-11-06 NOTE — Telephone Encounter (Signed)
Called pt about elevated TSH.  She feels well.  Since it is borderline high and asymptomatic,  Will keep her on her present synthroid dose and check at next visit.

## 2015-11-23 ENCOUNTER — Other Ambulatory Visit: Payer: Self-pay | Admitting: Unknown Physician Specialty

## 2015-12-20 ENCOUNTER — Other Ambulatory Visit: Payer: Self-pay | Admitting: Unknown Physician Specialty

## 2015-12-20 DIAGNOSIS — E039 Hypothyroidism, unspecified: Secondary | ICD-10-CM

## 2015-12-21 NOTE — Telephone Encounter (Signed)
Patient returned call and she stated she would come by in the morning to have TSH checked. Order entered.

## 2015-12-21 NOTE — Telephone Encounter (Signed)
Needs TSH.

## 2015-12-21 NOTE — Telephone Encounter (Signed)
Called and left patient a voicemail asking for her to please return my call.  

## 2015-12-22 ENCOUNTER — Other Ambulatory Visit: Payer: PRIVATE HEALTH INSURANCE

## 2015-12-22 DIAGNOSIS — E039 Hypothyroidism, unspecified: Secondary | ICD-10-CM

## 2015-12-23 LAB — TSH: TSH: 8.49 u[IU]/mL — ABNORMAL HIGH (ref 0.450–4.500)

## 2016-01-13 ENCOUNTER — Other Ambulatory Visit: Payer: Self-pay | Admitting: Unknown Physician Specialty

## 2016-01-27 ENCOUNTER — Other Ambulatory Visit: Payer: Self-pay | Admitting: Unknown Physician Specialty

## 2016-02-28 ENCOUNTER — Other Ambulatory Visit: Payer: Self-pay | Admitting: Unknown Physician Specialty

## 2016-02-28 DIAGNOSIS — E039 Hypothyroidism, unspecified: Secondary | ICD-10-CM

## 2016-02-29 NOTE — Telephone Encounter (Signed)
Pt needs a TSH 

## 2016-03-02 NOTE — Telephone Encounter (Signed)
Patient already has upcoming appointment 03/22/16.

## 2016-03-14 ENCOUNTER — Other Ambulatory Visit: Payer: Self-pay | Admitting: Family Medicine

## 2016-03-14 NOTE — Telephone Encounter (Signed)
rx

## 2016-03-20 ENCOUNTER — Other Ambulatory Visit: Payer: Self-pay | Admitting: Unknown Physician Specialty

## 2016-03-21 NOTE — Telephone Encounter (Signed)
Your patient 

## 2016-03-22 ENCOUNTER — Ambulatory Visit: Payer: Self-pay | Admitting: Unknown Physician Specialty

## 2016-03-27 ENCOUNTER — Other Ambulatory Visit: Payer: Self-pay | Admitting: Unknown Physician Specialty

## 2016-03-27 DIAGNOSIS — E039 Hypothyroidism, unspecified: Secondary | ICD-10-CM

## 2016-03-28 NOTE — Telephone Encounter (Signed)
Your patient 

## 2016-03-29 NOTE — Telephone Encounter (Signed)
Called and let patient know that her medication was refilled but [name] wants her to come by for a thyroid check. Patient stated she will come by as soon as she could.

## 2016-04-28 ENCOUNTER — Other Ambulatory Visit: Payer: Self-pay | Admitting: Unknown Physician Specialty

## 2016-05-03 ENCOUNTER — Telehealth: Payer: Self-pay | Admitting: Unknown Physician Specialty

## 2016-05-03 NOTE — Telephone Encounter (Signed)
Pt called stated she needs a refill on Levothyroxine. [facility] [place]. Thanks.

## 2016-05-03 NOTE — Telephone Encounter (Signed)
Called and spoke to pharmacy tech because according to chart pt should have refills. They stated that the pt has a 90 day prescription on hold so I asked them to go ahead and get that ready.Will call pt and let her know.

## 2016-05-03 NOTE — Telephone Encounter (Signed)
Called and let pt know that the pharmacy has a 90 day prescription on hold and they are getting it ready for her.

## 2016-05-20 NOTE — Telephone Encounter (Signed)
Your patient 

## 2016-06-14 ENCOUNTER — Other Ambulatory Visit: Payer: Self-pay | Admitting: Unknown Physician Specialty

## 2016-09-10 ENCOUNTER — Other Ambulatory Visit: Payer: Self-pay | Admitting: Unknown Physician Specialty

## 2016-10-09 ENCOUNTER — Other Ambulatory Visit: Payer: Self-pay | Admitting: Unknown Physician Specialty

## 2016-12-14 ENCOUNTER — Other Ambulatory Visit: Payer: Self-pay | Admitting: Unknown Physician Specialty

## 2017-01-19 ENCOUNTER — Other Ambulatory Visit: Payer: Self-pay | Admitting: Unknown Physician Specialty

## 2017-01-20 IMAGING — CR DG CHEST 2V
2 series · 2 of 2 positions shown · non-contrast
Comparison: Chest radiograph performed 05/10/2014

CLINICAL DATA: Acute onset of tachycardia.  Initial encounter.

EXAM:
CHEST  2 VIEW

[chest pa]
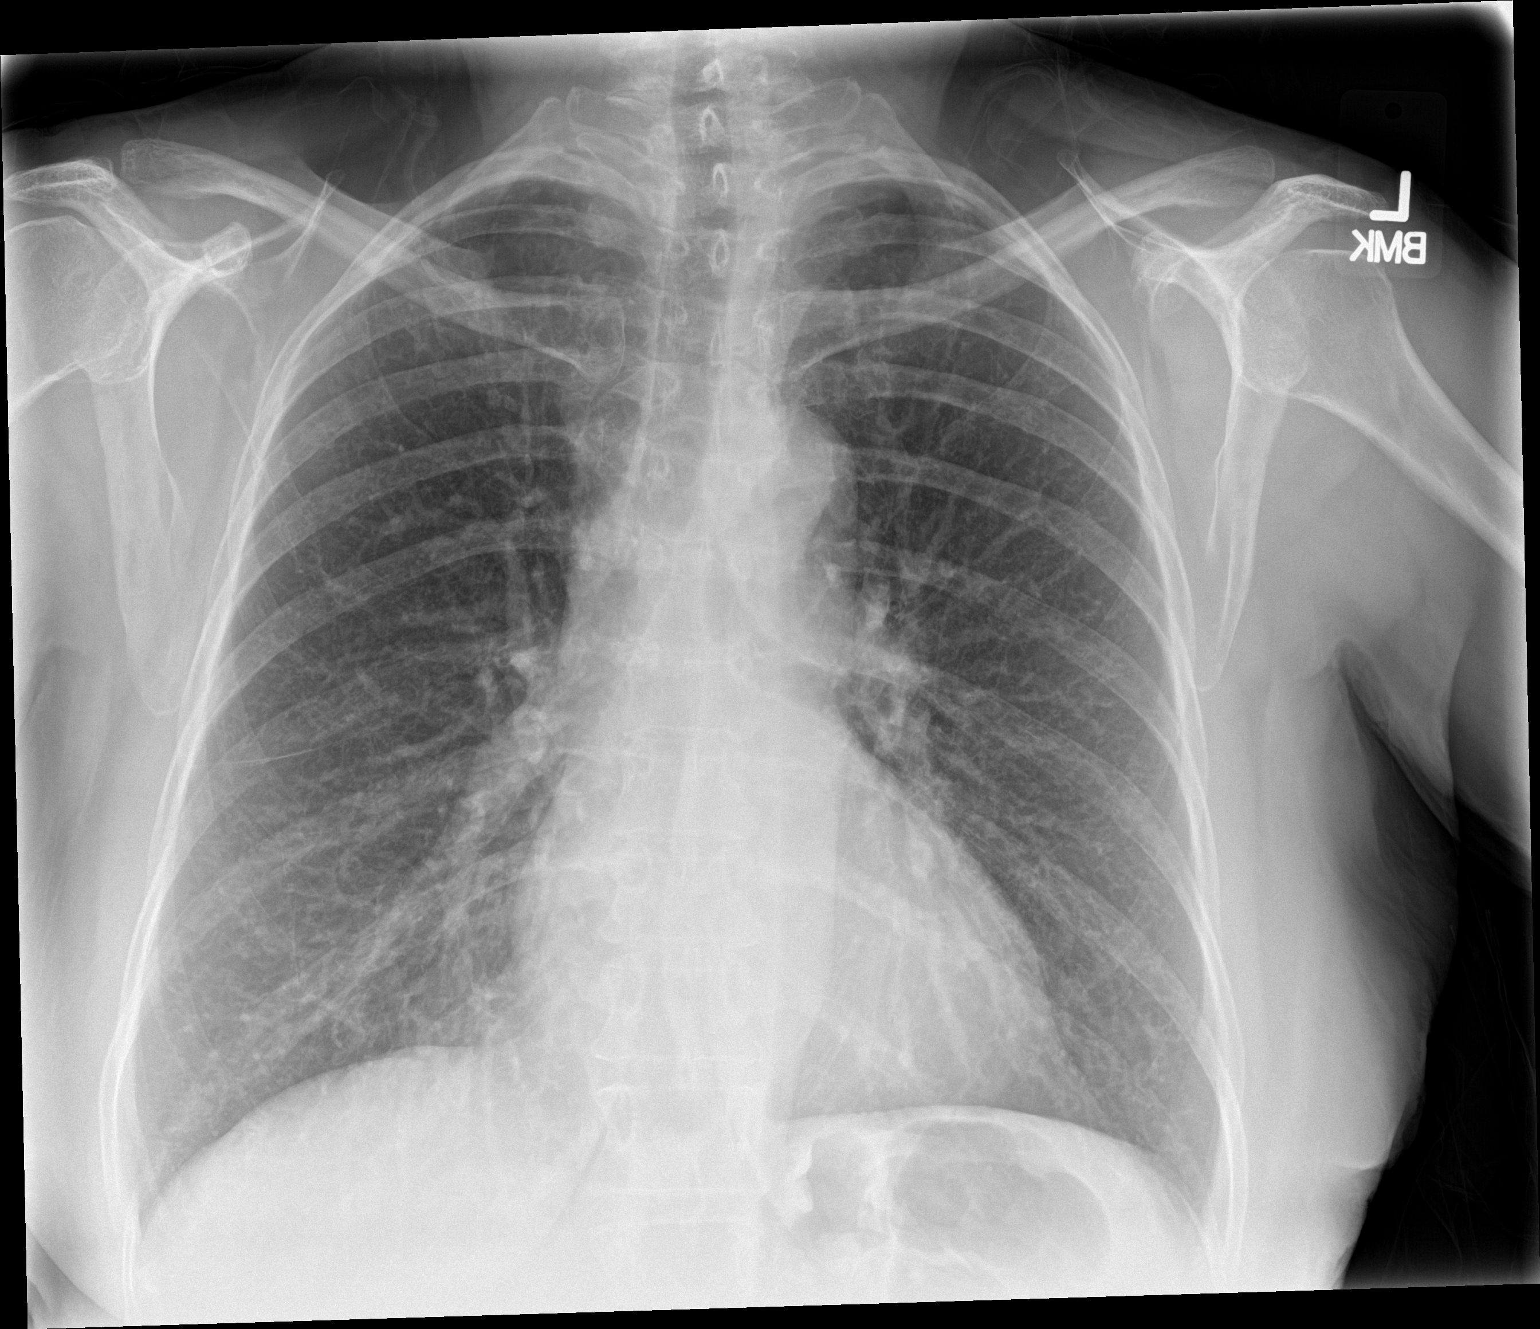

[chest lat]
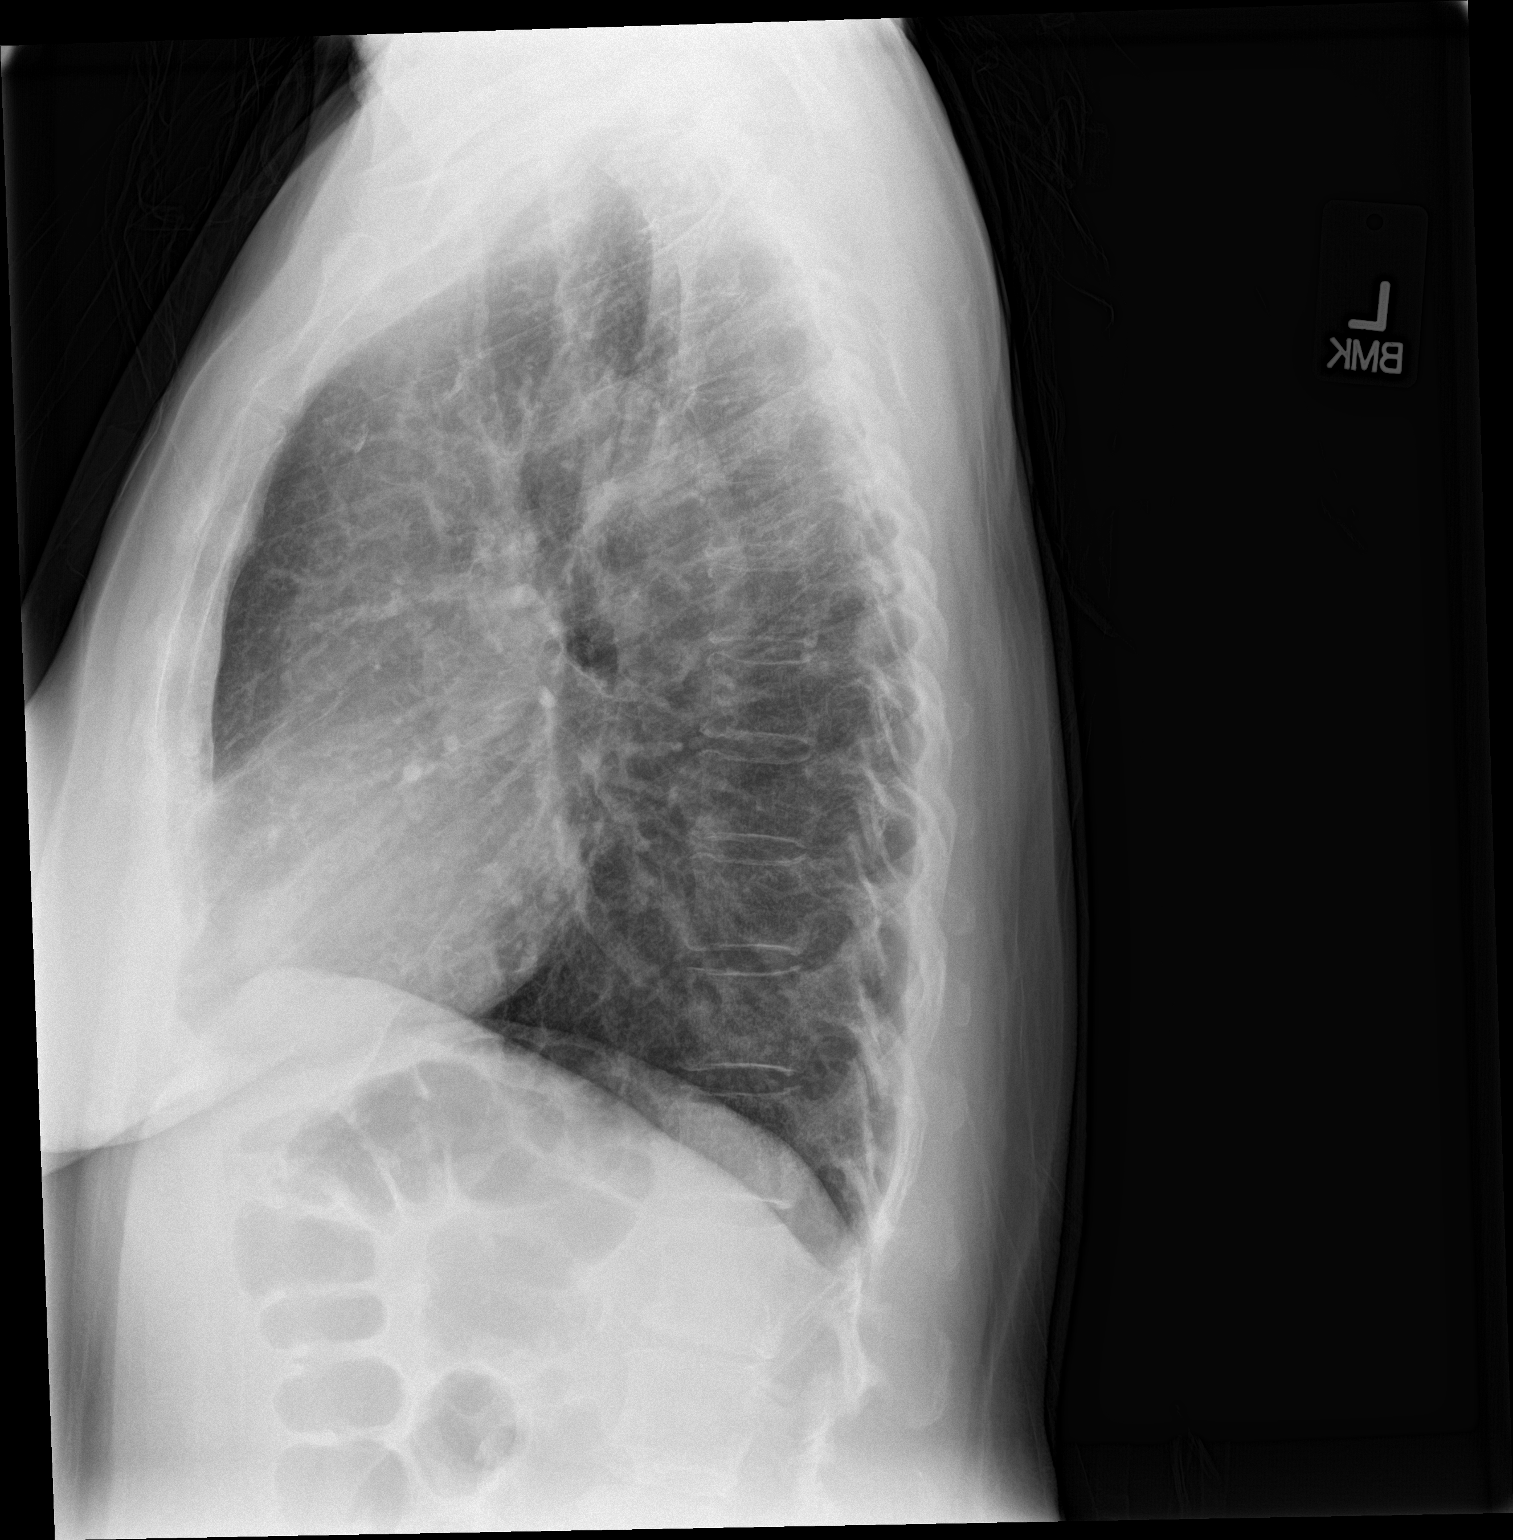

[2 of 2 positions shown; findings below may reference images not displayed]

FINDINGS: The lungs are well-aerated. Mild peribronchial thickening is noted.
Mildly increased interstitial markings are nonspecific. There is no
evidence of focal opacification, pleural effusion or pneumothorax.

The heart is normal in size; the mediastinal contour is within
normal limits. No acute osseous abnormalities are seen.
IMPRESSION: Mild peribronchial thickening noted. Mildly increased interstitial
markings are nonspecific. Lungs otherwise grossly clear.

## 2017-02-15 ENCOUNTER — Other Ambulatory Visit: Payer: Self-pay | Admitting: Unknown Physician Specialty

## 2017-02-21 ENCOUNTER — Telehealth: Payer: Self-pay | Admitting: Unknown Physician Specialty

## 2017-02-21 MED ORDER — AMLODIPINE BESYLATE 2.5 MG PO TABS: 2.5000 mg | ORAL_TABLET | Freq: Every day | ORAL | 0 refills | Status: DC

## 2017-02-21 NOTE — Telephone Encounter (Signed)
Left message on patients VM [facility] Left number for patient to call to schedule

## 2017-02-21 NOTE — Telephone Encounter (Signed)
Please call and schedule patient a f/up visit with Elnita Maxwellheryl for further medication refills. She has not been seen in over a year.

## 2017-02-21 NOTE — Telephone Encounter (Signed)
Pt called and would like [facility] [place]

## 2017-02-21 NOTE — Telephone Encounter (Signed)
Routing to provider. According to chart, patient has not been seen in over a year. Please route back to me without closing encounter so I can call and schedule patient an appointment.

## 2017-02-22 NOTE — Telephone Encounter (Signed)
Letter generated and sent to patient.  

## 2017-02-27 ENCOUNTER — Ambulatory Visit: Payer: Self-pay | Admitting: Unknown Physician Specialty

## 2017-02-28 ENCOUNTER — Encounter: Payer: Self-pay | Admitting: Unknown Physician Specialty

## 2017-02-28 ENCOUNTER — Ambulatory Visit (INDEPENDENT_AMBULATORY_CARE_PROVIDER_SITE_OTHER): Payer: Medicare HMO | Admitting: Unknown Physician Specialty

## 2017-02-28 VITALS — BP 121/74 | HR 66 | Temp 98.4°F | Ht <= 58 in | Wt 119.1 lb

## 2017-02-28 DIAGNOSIS — F17200 Nicotine dependence, unspecified, uncomplicated: Secondary | ICD-10-CM | POA: Diagnosis not present

## 2017-02-28 DIAGNOSIS — E039 Hypothyroidism, unspecified: Secondary | ICD-10-CM | POA: Diagnosis not present

## 2017-02-28 DIAGNOSIS — R69 Illness, unspecified: Secondary | ICD-10-CM | POA: Diagnosis not present

## 2017-02-28 DIAGNOSIS — I10 Essential (primary) hypertension: Secondary | ICD-10-CM | POA: Diagnosis not present

## 2017-02-28 NOTE — Progress Notes (Signed)
BP 121/74   Pulse 66   Temp 98.4 F (36.9 C)   Ht 4\' 10"  (1.473 m)   Wt 119 lb 1.6 oz (54 kg)   LMP  (LMP Unknown)   SpO2 93%   BMI 24.89 kg/m    Subjective:    Patient ID: Savannah Whitaker, female    DOB: [birth year], 66 y.o.   MRN: [id number]  HPI: Savannah Whitaker is a 66 y.o. female  Chief Complaint  Patient presents with  . Anxiety  . Hypertension  . Hypothyroidism   Anxiety Doing well with her nerves [facility] 2/9 02/28/2017 09/22/2015  Decreased Interest 0 0  Down, Depressed, Hopeless 0 0  PHQ - 2 Score 0 0  Altered sleeping 0 1  Tired, decreased energy 0 1  Change in appetite 0 1  Feeling bad or failure about yourself  0 0  Trouble concentrating 0 0  Moving slowly or fidgety/restless 0 0  Suicidal thoughts 0 0  PHQ-9 Score 0 3    Hypertension Using medications without difficulty Average home BPs Not checking  No problems or lightheadedness No chest pain with exertion or shortness of breath No Edema  Hypothyroid No fatigue.  Working 2 jobs.    Social History   Social History  . Marital status: Divorced    Spouse name: N/A  . Number of children: N/A  . Years of education: N/A   Occupational History  . Not on file.   Social History Main Topics  . Smoking status: Current Every Day Smoker    Packs/day: 0.50    Years: 40.00    Types: Cigarettes  . Smokeless tobacco: Never Used  . Alcohol use No  . Drug use: No  . Sexual activity: Yes   Other Topics Concern  . Not on file   Social History Narrative  . No narrative on file       Relevant past medical, surgical, family and social history reviewed and updated as indicated. Interim medical history since our last visit reviewed. Allergies and medications reviewed and updated.  Review of Systems  Per HPI unless specifically indicated above     Objective:    BP 121/74   Pulse 66   Temp 98.4 F (36.9 C)   Ht 4\' 10"  (1.473 m)   Wt 119 lb 1.6 oz (54 kg)   LMP   (LMP Unknown)   SpO2 93%   BMI 24.89 kg/m   Wt Readings from Last 3 Encounters:  02/28/17 119 lb 1.6 oz (54 kg)  09/22/15 114 lb 12.8 oz (52.1 kg)  08/24/15 116 lb 6.4 oz (52.8 kg)    Physical Exam  Constitutional: She is oriented to person, place, and time. She appears well-developed and well-nourished. No distress.  HENT:  Head: Normocephalic and atraumatic.  Eyes: Conjunctivae and lids are normal. Right eye exhibits no discharge. Left eye exhibits no discharge. No scleral icterus.  Neck: Normal range of motion. Neck supple. No JVD present. Carotid bruit is not present.  Cardiovascular: Normal rate, regular rhythm and normal heart sounds.   Pulmonary/Chest: Effort normal and breath sounds normal.  Abdominal: Normal appearance. There is no splenomegaly or hepatomegaly.  Musculoskeletal: Normal range of motion.  Neurological: She is alert and oriented to person, place, and time.  Skin: Skin is warm, dry and intact. No rash noted. No pallor.  Psychiatric: She has a normal mood and affect. Her behavior is normal. Judgment and thought content normal.  Results for orders placed or performed in visit on 12/22/15  TSH  Result Value Ref Range   TSH 8.490 (H) 0.450 - 4.500 uIU/mL      Assessment & Plan:   Problem List Items Addressed This Visit      Unprioritized   Hypertension - Primary   Relevant Orders   Comprehensive metabolic panel   Lipid Panel w/o Chol/HDL Ratio   Hypothyroidism    Check TSH.  Good energy      Tobacco use disorder     I have recommended absolute tobacco cessation. I have discussed various options available for assistance with tobacco cessation including over the counter methods (Nicotine gum, patch and lozenges). We also discussed prescription options (Chantix, Nicotine Inhaler / Nasal Spray). The patient is not interested in pursuing any prescription tobacco cessation options at this time.  Low dose chest CT           Follow up plan: Return for  Schedule PE.

## 2017-02-28 NOTE — Assessment & Plan Note (Addendum)
I have recommended absolute tobacco cessation. I have discussed various options available for assistance with tobacco cessation including over the counter methods (Nicotine gum, patch and lozenges). We also discussed prescription options (Chantix, Nicotine Inhaler / Nasal Spray). The patient is not interested in pursuing any prescription tobacco cessation options at this time.  Low dose chest CT

## 2017-02-28 NOTE — Assessment & Plan Note (Signed)
Check TSH.  Good energy

## 2017-03-01 ENCOUNTER — Telehealth: Payer: Self-pay | Admitting: *Deleted

## 2017-03-01 ENCOUNTER — Telehealth: Payer: Self-pay | Admitting: Unknown Physician Specialty

## 2017-03-01 DIAGNOSIS — Z87891 Personal history of nicotine dependence: Secondary | ICD-10-CM

## 2017-03-01 LAB — TSH: TSH: 5.85 u[IU]/mL — ABNORMAL HIGH (ref 0.450–4.500)

## 2017-03-01 LAB — COMPREHENSIVE METABOLIC PANEL
ALK PHOS: 46 IU/L (ref 39–117)
ALT: 10 IU/L (ref 0–32)
AST: 16 IU/L (ref 0–40)
Albumin/Globulin Ratio: 1.5 (ref 1.2–2.2)
Albumin: 4.2 g/dL (ref 3.6–4.8)
BUN/Creatinine Ratio: 17 (ref 12–28)
BUN: 12 mg/dL (ref 8–27)
Bilirubin Total: 0.3 mg/dL (ref 0.0–1.2)
CO2: 22 mmol/L (ref 20–29)
CREATININE: 0.71 mg/dL (ref 0.57–1.00)
Calcium: 8.8 mg/dL (ref 8.7–10.3)
Chloride: 104 mmol/L (ref 96–106)
GFR calc Af Amer: 103 mL/min/{1.73_m2} (ref >59)
GFR calc non Af Amer: 90 mL/min/{1.73_m2} (ref >59)
GLUCOSE: 95 mg/dL (ref 65–99)
Globulin, Total: 2.8 g/dL (ref 1.5–4.5)
Potassium: 3.9 mmol/L (ref 3.5–5.2)
Sodium: 140 mmol/L (ref 134–144)
Total Protein: 7 g/dL (ref 6.0–8.5)

## 2017-03-01 LAB — LIPID PANEL W/O CHOL/HDL RATIO
CHOLESTEROL TOTAL: 259 mg/dL — AB (ref 100–199)
HDL: 31 mg/dL — ABNORMAL LOW (ref >39)
TRIGLYCERIDES: 619 mg/dL — AB (ref 0–149)

## 2017-03-01 MED ORDER — LEVOTHYROXINE SODIUM 75 MCG PO TABS: 75.0000 ug | ORAL_TABLET | Freq: Every day | ORAL | 3 refills | Status: DC

## 2017-03-01 MED ORDER — GEMFIBROZIL 600 MG PO TABS: 600.0000 mg | ORAL_TABLET | Freq: Two times a day (BID) | ORAL | 3 refills | Status: DC

## 2017-03-01 NOTE — Telephone Encounter (Signed)
Received referral for initial lung cancer screening scan. Contacted patient and obtained smoking history,(current, 40 pack year) as well as answering questions related to screening process. Patient denies signs of lung cancer such as weight loss or hemoptysis. Patient denies comorbidity that would prevent curative treatment if lung cancer were found. Patient is scheduled for shared decision making visit and CT scan on 03/14/17.

## 2017-03-01 NOTE — Telephone Encounter (Signed)
Discussed high Triglycerides.  Drinking herbal life shakes.  No ETOH.  TSH high.  Increase Levothyroine to 75.  Recheck in 3 months

## 2017-03-14 ENCOUNTER — Encounter: Payer: Self-pay | Admitting: Oncology

## 2017-03-14 ENCOUNTER — Inpatient Hospital Stay: Payer: Medicare HMO | Attending: Oncology | Admitting: Oncology

## 2017-03-14 ENCOUNTER — Ambulatory Visit
Admission: RE | Admit: 2017-03-14 | Discharge: 2017-03-14 | Disposition: A | Discharge status: Home / Self Care | Payer: Medicare HMO | Source: Ambulatory Visit | Attending: Oncology | Admitting: Oncology

## 2017-03-14 DIAGNOSIS — I700 Atherosclerosis of aorta: Secondary | ICD-10-CM | POA: Diagnosis not present

## 2017-03-14 DIAGNOSIS — Z87891 Personal history of nicotine dependence: Secondary | ICD-10-CM

## 2017-03-14 DIAGNOSIS — F17210 Nicotine dependence, cigarettes, uncomplicated: Secondary | ICD-10-CM

## 2017-03-14 DIAGNOSIS — R69 Illness, unspecified: Secondary | ICD-10-CM | POA: Diagnosis not present

## 2017-03-14 DIAGNOSIS — I2510 Atherosclerotic heart disease of native coronary artery without angina pectoris: Secondary | ICD-10-CM | POA: Insufficient documentation

## 2017-03-14 DIAGNOSIS — J439 Emphysema, unspecified: Secondary | ICD-10-CM | POA: Insufficient documentation

## 2017-03-14 DIAGNOSIS — Z122 Encounter for screening for malignant neoplasm of respiratory organs: Secondary | ICD-10-CM | POA: Diagnosis not present

## 2017-03-14 NOTE — Progress Notes (Signed)
In accordance with CMS guidelines, patient has met eligibility criteria including age, absence of signs or symptoms of lung cancer.  Social History  Substance Use Topics  . Smoking status: Current Every Day Smoker    Packs/day: 0.50    Years: 40.00    Types: Cigarettes  . Smokeless tobacco: Never Used  . Alcohol use No     A shared decision-making session was conducted prior to the performance of CT scan. This includes one or more decision aids, includes benefits and harms of screening, follow-up diagnostic testing, over-diagnosis, false positive rate, and total radiation exposure.  Counseling on the importance of adherence to annual lung cancer LDCT screening, impact of co-morbidities, and ability or willingness to undergo diagnosis and treatment is imperative for compliance of the program.  Counseling on the importance of continued smoking cessation for former smokers; the importance of smoking cessation for current smokers, and information about tobacco cessation interventions have been given to patient including [place] and [facility] programs.  Written order for lung cancer screening with LDCT has been given to the patient and any and all questions have been answered to the best of my abilities.   Yearly follow up will be coordinated by Burgess Estelle, Thoracic Navigator.  Faythe Casa, NP 03/14/2017 8:15 AM

## 2017-03-15 ENCOUNTER — Telehealth: Payer: Self-pay | Admitting: *Deleted

## 2017-03-15 NOTE — Telephone Encounter (Signed)
Notified patient of LDCT lung cancer screening program results with recommendation for 12 month follow up imaging. Also notified of incidental findings noted below and is encouraged to discuss further with PCP who will receive a copy of this note and/or the CT report. Patient verbalizes understanding.   IMPRESSION: 1. Lung-RADS 2S, benign appearance or behavior. Continue annual screening with low-dose chest CT without contrast in 12 months. 2. The "S" modifier above refers to potentially clinically significant non lung cancer related findings. Specifically, there is aortic atherosclerosis, in addition to 2 vessel coronary artery disease. Please note that although the presence of coronary artery calcium documents the presence of coronary artery disease, the severity of this disease and any potential stenosis cannot be assessed on this non-gated CT examination. Assessment for potential risk factor modification, dietary therapy or pharmacologic therapy may be warranted, if clinically indicated. 3. Mild diffuse bronchial wall thickening with mild paraseptal emphysema; imaging findings suggestive of underlying COPD.  Aortic Atherosclerosis (ICD10-I70.0) and Emphysema (ICD10-J43.9).

## 2017-03-23 ENCOUNTER — Other Ambulatory Visit: Payer: Self-pay | Admitting: Unknown Physician Specialty

## 2017-04-22 ENCOUNTER — Other Ambulatory Visit: Payer: Self-pay | Admitting: Unknown Physician Specialty

## 2017-04-23 NOTE — Telephone Encounter (Signed)
rx

## 2017-04-24 ENCOUNTER — Other Ambulatory Visit: Payer: Self-pay | Admitting: Unknown Physician Specialty

## 2017-05-11 ENCOUNTER — Other Ambulatory Visit: Payer: Self-pay | Admitting: Unknown Physician Specialty

## 2017-05-31 ENCOUNTER — Ambulatory Visit (INDEPENDENT_AMBULATORY_CARE_PROVIDER_SITE_OTHER): Payer: Medicare HMO | Admitting: Unknown Physician Specialty

## 2017-05-31 ENCOUNTER — Encounter: Payer: Self-pay | Admitting: Unknown Physician Specialty

## 2017-05-31 VITALS — BP 127/71 | HR 65 | Temp 98.5°F | Wt 118.6 lb

## 2017-05-31 DIAGNOSIS — I10 Essential (primary) hypertension: Secondary | ICD-10-CM

## 2017-05-31 DIAGNOSIS — E785 Hyperlipidemia, unspecified: Secondary | ICD-10-CM

## 2017-05-31 DIAGNOSIS — R69 Illness, unspecified: Secondary | ICD-10-CM | POA: Diagnosis not present

## 2017-05-31 DIAGNOSIS — F17200 Nicotine dependence, unspecified, uncomplicated: Secondary | ICD-10-CM

## 2017-05-31 DIAGNOSIS — E039 Hypothyroidism, unspecified: Secondary | ICD-10-CM | POA: Diagnosis not present

## 2017-05-31 NOTE — Assessment & Plan Note (Signed)
Stable, continue present medications.   

## 2017-05-31 NOTE — Assessment & Plan Note (Signed)
Check today.  TSH elevated last visit

## 2017-05-31 NOTE — Progress Notes (Signed)
BP 127/71   Pulse 65   Temp 98.5 F (36.9 C)   Wt 118 lb 9.6 oz (53.8 kg)   LMP  (LMP Unknown)   SpO2 95%   BMI 24.79 kg/m    Subjective:    Patient ID: Savannah Whitaker, female    DOB: [birth year], 66 y.o.   MRN: [id number]  HPI: Savannah CottaDonna Florence Hirschhorn is a 66 y.o. female  Chief Complaint  Patient presents with  . Anxiety  . Hypothyroidism   Anxiety Doing well with her nerves [facility] 2[facility] 05/31/2017 02/28/2017 09/22/2015  Decreased Interest 0 0 0  Down, Depressed, Hopeless 0 0 0  PHQ - 2 Score 0 0 0  Altered sleeping 0 0 1  Tired, decreased energy 0 0 1  Change in appetite 0 0 1  Feeling bad or failure about yourself  0 0 0  Trouble concentrating 0 0 0  Moving slowly or fidgety/restless 0 0 0  Suicidal thoughts 0 0 0  PHQ-9 Score 0 0 3    Hypertension Using medications without difficulty Average home BPs checks every once in a while and typically OK  No problems or lightheadedness No chest pain with exertion or shortness of breath No Edema   Hyperlipidemia Had coffee this AM with a little milk.  Takes Gemfibrizole daily but often misses the first dose Using medications without problems: No Muscle aches  Diet compliance:Exercise: Works and stays busy.  Thinking of silver sneakers  Hypothyroid No fatigue other than she works hard.    Relevant past medical, surgical, family and social history reviewed and updated as indicated. Interim medical history since our last visit reviewed. Allergies and medications reviewed and updated.  Review of Systems  Per HPI unless specifically indicated above     Objective:    BP 127/71   Pulse 65   Temp 98.5 F (36.9 C)   Wt 118 lb 9.6 oz (53.8 kg)   LMP  (LMP Unknown)   SpO2 95%   BMI 24.79 kg/m   Wt Readings from Last 3 Encounters:  05/31/17 118 lb 9.6 oz (53.8 kg)  03/14/17 118 lb (53.5 kg)  02/28/17 119 lb 1.6 oz (54 kg)    Physical Exam  Constitutional: She is oriented to person, place,  and time. She appears well-developed and well-nourished. No distress.  HENT:  Head: Normocephalic and atraumatic.  Eyes: Conjunctivae and lids are normal. Right eye exhibits no discharge. Left eye exhibits no discharge. No scleral icterus.  Neck: Normal range of motion. Neck supple. No JVD present. Carotid bruit is not present.  Cardiovascular: Normal rate, regular rhythm and normal heart sounds.   Pulmonary/Chest: Effort normal and breath sounds normal.  Abdominal: Normal appearance. There is no splenomegaly or hepatomegaly.  Musculoskeletal: Normal range of motion.  Neurological: She is alert and oriented to person, place, and time.  Skin: Skin is warm, dry and intact. No rash noted. No pallor.  Psychiatric: She has a normal mood and affect. Her behavior is normal. Judgment and thought content normal.    Results for orders placed or performed in visit on 02/28/17  Comprehensive metabolic panel  Result Value Ref Range   Glucose 95 65 - 99 mg/dL   BUN 12 8 - 27 mg/dL   Creatinine, Ser 4.090.71 0.57 - 1.00 mg/dL   GFR calc non Af Amer 90 >59 mL/min/1.73   GFR calc Af Amer 103 >59 mL/min/1.73   BUN/Creatinine Ratio 17 12 - 28   Sodium  140 134 - 144 mmol/L   Potassium 3.9 3.5 - 5.2 mmol/L   Chloride 104 96 - 106 mmol/L   CO2 22 20 - 29 mmol/L   Calcium 8.8 8.7 - 10.3 mg/dL   Total Protein 7.0 6.0 - 8.5 g/dL   Albumin 4.2 3.6 - 4.8 g/dL   Globulin, Total 2.8 1.5 - 4.5 g/dL   Albumin/Globulin Ratio 1.5 1.2 - 2.2   Bilirubin Total 0.3 0.0 - 1.2 mg/dL   Alkaline Phosphatase 46 39 - 117 IU/L   AST 16 0 - 40 IU/L   ALT 10 0 - 32 IU/L  Lipid Panel w/o Chol/HDL Ratio  Result Value Ref Range   Cholesterol, Total 259 (H) 100 - 199 mg/dL   Triglycerides 960 (HH) 0 - 149 mg/dL   HDL 31 (L) >45 mg/dL   VLDL Cholesterol Cal Comment 5 - 40 mg/dL   LDL Calculated Comment 0 - 99 mg/dL  TSH  Result Value Ref Range   TSH 5.850 (H) 0.450 - 4.500 uIU/mL      Assessment & Plan:   Problem List  Items Addressed This Visit      Unprioritized   Hyperlipidemia - Primary   Relevant Orders   Lipid Panel w/o Chol/HDL Ratio   Hypertension    Stable, continue present medications.        Hypothyroidism    Check today.  TSH elevated last visit      Relevant Orders   Thyroid Panel With TSH   Tobacco use disorder    Encouraged to quit          Follow up plan: Return in about 6 months (around 11/28/2017) for also needs Medicare PE.

## 2017-05-31 NOTE — Assessment & Plan Note (Signed)
Encouraged to quit. 

## 2017-06-01 LAB — LIPID PANEL W/O CHOL/HDL RATIO
CHOLESTEROL TOTAL: 254 mg/dL — AB (ref 100–199)
HDL: 40 mg/dL (ref >39)
LDL CALC: 176 mg/dL — AB (ref 0–99)
TRIGLYCERIDES: 188 mg/dL — AB (ref 0–149)
VLDL CHOLESTEROL CAL: 38 mg/dL (ref 5–40)

## 2017-06-01 LAB — THYROID PANEL WITH TSH
Free Thyroxine Index: 1.5 (ref 1.2–4.9)
T3 Uptake Ratio: 24 % (ref 24–39)
T4, Total: 6.1 ug/dL (ref 4.5–12.0)
TSH: 1.96 u[IU]/mL (ref 0.450–4.500)

## 2017-06-06 ENCOUNTER — Other Ambulatory Visit: Payer: Self-pay | Admitting: Unknown Physician Specialty

## 2017-06-06 DIAGNOSIS — E785 Hyperlipidemia, unspecified: Secondary | ICD-10-CM

## 2017-06-06 MED ORDER — ATORVASTATIN CALCIUM 20 MG PO TABS: 20.0000 mg | ORAL_TABLET | Freq: Every day | ORAL | 3 refills | Status: DC

## 2017-06-07 ENCOUNTER — Other Ambulatory Visit: Payer: Self-pay | Admitting: Unknown Physician Specialty

## 2017-07-13 DIAGNOSIS — R69 Illness, unspecified: Secondary | ICD-10-CM | POA: Diagnosis not present

## 2017-08-14 ENCOUNTER — Encounter: Payer: Self-pay | Admitting: Unknown Physician Specialty

## 2017-08-14 ENCOUNTER — Ambulatory Visit
Admission: RE | Admit: 2017-08-14 | Discharge: 2017-08-14 | Disposition: A | Discharge status: Home / Self Care | Payer: Medicare HMO | Source: Ambulatory Visit | Attending: Unknown Physician Specialty | Admitting: Unknown Physician Specialty

## 2017-08-14 ENCOUNTER — Ambulatory Visit (INDEPENDENT_AMBULATORY_CARE_PROVIDER_SITE_OTHER): Payer: Medicare HMO | Admitting: Unknown Physician Specialty

## 2017-08-14 VITALS — BP 134/76 | HR 80 | Temp 100.0°F | Wt 122.9 lb

## 2017-08-14 DIAGNOSIS — R05 Cough: Secondary | ICD-10-CM | POA: Insufficient documentation

## 2017-08-14 DIAGNOSIS — R0689 Other abnormalities of breathing: Secondary | ICD-10-CM

## 2017-08-14 DIAGNOSIS — I700 Atherosclerosis of aorta: Secondary | ICD-10-CM | POA: Insufficient documentation

## 2017-08-14 DIAGNOSIS — R059 Cough, unspecified: Secondary | ICD-10-CM

## 2017-08-14 LAB — VERITOR FLU A/B WAIVED
INFLUENZA B: NEGATIVE
Influenza A: NEGATIVE

## 2017-08-14 MED ORDER — DOXYCYCLINE HYCLATE 100 MG PO TABS: 100.0000 mg | ORAL_TABLET | Freq: Two times a day (BID) | ORAL | 0 refills | Status: DC

## 2017-08-14 MED ORDER — HYDROCOD POLST-CPM POLST ER 10-8 MG/5ML PO SUER: 5.0000 mL | Freq: Two times a day (BID) | ORAL | 0 refills | Status: DC | PRN

## 2017-08-14 NOTE — Progress Notes (Signed)
BP 134/76   Pulse 80   Temp 100 F (37.8 C) (Oral)   Wt 122 lb 14.4 oz (55.7 kg)   LMP  (LMP Unknown)   SpO2 96%   BMI 25.69 kg/m    Subjective:    Patient ID: Savannah Whitaker, female    DOB: 30-Jun-1951, 67 y.o.   MRN: [id number]  HPI: Savannah CottaDonna Florence Balz is a 67 y.o. female  Chief Complaint  Patient presents with  . URI    pt states she has had a cough and felt like she was getting something since yesterday   Influenza  This is a new problem. The current episode started yesterday. The problem occurs constantly. The problem has been unchanged. Associated symptoms include congestion, coughing, diaphoresis, fatigue, a fever, headaches and myalgias. Pertinent negatives include no abdominal pain, arthralgias, chest pain, chills, joint swelling, nausea, neck pain, numbness, rash, sore throat, swollen glands, urinary symptoms, vertigo, visual change, vomiting or weakness. Nothing aggravates the symptoms. The treatment provided no relief.    Relevant past medical, surgical, family and social history reviewed and updated as indicated. Interim medical history since our last visit reviewed. Allergies and medications reviewed and updated.  Review of Systems  Constitutional: Positive for diaphoresis, fatigue and fever. Negative for chills.  HENT: Positive for congestion. Negative for sore throat.   Respiratory: Positive for cough.   Cardiovascular: Negative for chest pain.  Gastrointestinal: Negative for abdominal pain, nausea and vomiting.  Musculoskeletal: Positive for myalgias. Negative for arthralgias, joint swelling and neck pain.  Skin: Negative for rash.  Neurological: Positive for headaches. Negative for vertigo, weakness and numbness.    Per HPI unless specifically indicated above     Objective:    BP 134/76   Pulse 80   Temp 100 F (37.8 C) (Oral)   Wt 122 lb 14.4 oz (55.7 kg)   LMP  (LMP Unknown)   SpO2 96%   BMI 25.69 kg/m   Wt Readings from Last 3  Encounters:  08/14/17 122 lb 14.4 oz (55.7 kg)  05/31/17 118 lb 9.6 oz (53.8 kg)  03/14/17 118 lb (53.5 kg)    Physical Exam  Constitutional: She is oriented to person, place, and time. She appears well-developed and well-nourished. No distress.  HENT:  Head: Normocephalic and atraumatic.  Eyes: Conjunctivae and lids are normal. Right eye exhibits no discharge. Left eye exhibits no discharge. No scleral icterus.  Cardiovascular: Normal rate.  Pulmonary/Chest: Effort normal.  Abdominal: Normal appearance. There is no splenomegaly or hepatomegaly.  Musculoskeletal:     Neurological: She is alert and oriented to person, place, and time.  Skin: Skin is intact. No rash noted. No pallor.  Psychiatric: She has a normal mood and affect. Her behavior is normal. Judgment and thought content normal.    Results for orders placed or performed in visit on 05/31/17  Thyroid Panel With TSH  Result Value Ref Range   TSH 1.960 0.450 - 4.500 uIU/mL   T4, Total 6.1 4.5 - 12.0 ug/dL   T3 Uptake Ratio 24 24 - 39 %   Free Thyroxine Index 1.5 1.2 - 4.9  Lipid Panel w/o Chol/HDL Ratio  Result Value Ref Range   Cholesterol, Total 254 (H) 100 - 199 mg/dL   Triglycerides 409188 (H) 0 - 149 mg/dL   HDL 40 >81>39 mg/dL   VLDL Cholesterol Cal 38 5 - 40 mg/dL   LDL Calculated 191176 (H) 0 - 99 mg/dL      Assessment &  Plan:   Problem List Items Addressed This Visit    None    Visit Diagnoses    Cough    -  Primary   Pt with new cough. R/O pneumonia due to crackles in the bases.  Will rx Doxycycline 100 mg BID.  Tussinex for cough   Relevant Orders   Veritor Flu A/B Waived   DG Chest 2 View   Adventitious breath sounds       ? pneumonia vs COPD changes.  No past history of COPD but long-time smoker.  Rx for Doxy and get chest x-ray   Relevant Orders   DG Chest 2 View       Follow up plan: Return if symptoms worsen or fail to improve.

## 2017-08-21 ENCOUNTER — Telehealth: Payer: Self-pay | Admitting: Unknown Physician Specialty

## 2017-08-21 NOTE — Telephone Encounter (Signed)
Copied from CRM [phone]. Topic: Quick Communication - Rx Refill/Question >> Aug 21, 2017 10:52 AM Louie BunPalacios Medina, Rosey Batheresa D wrote: Medication: doxycycline (VIBRA-TABS) 100 MG tablet, chlorpheniramine-HYDROcodone (TUSSIONEX PENNKINETIC ER) 10-8 MG/5ML SURE.  Has the patient contacted their pharmacy?Yes   (Agent: If no, request that the patient contact the pharmacy for the refill.) Patient would like another round of these medications sent to her pharmacy.    Preferred Pharmacy (with phone number or street name): [id number] - [place], [place] - [address]   Agent: Please be advised that RX refills may take up to 3 business days. We ask that you follow-up with your pharmacy.

## 2017-08-21 NOTE — Telephone Encounter (Signed)
Call pt 

## 2017-08-21 NOTE — Telephone Encounter (Signed)
Pt  Seen  On   08/14/2017      Had   Doxycycline     And  Tussionex    Prescribed      -  Pt  Is  Calling   For  A  Refill

## 2017-08-21 NOTE — Telephone Encounter (Signed)
Left message on machine for pt to return call to the office.  

## 2017-08-22 NOTE — Telephone Encounter (Signed)
Phone call Discussed with patient cough and infection getting better further review patient does not need refills on cough medicine or antibiotics.

## 2017-08-22 NOTE — Telephone Encounter (Signed)
Patient was transferred to provider for telephone conversation.   

## 2017-10-20 ENCOUNTER — Other Ambulatory Visit: Payer: Self-pay | Admitting: Unknown Physician Specialty

## 2017-11-09 ENCOUNTER — Other Ambulatory Visit: Payer: Self-pay | Admitting: Unknown Physician Specialty

## 2017-11-28 ENCOUNTER — Encounter: Payer: Medicare HMO | Admitting: Unknown Physician Specialty

## 2017-12-04 ENCOUNTER — Other Ambulatory Visit: Payer: Self-pay | Admitting: Unknown Physician Specialty

## 2018-01-24 DIAGNOSIS — R05 Cough: Secondary | ICD-10-CM | POA: Diagnosis not present

## 2018-01-24 DIAGNOSIS — R52 Pain, unspecified: Secondary | ICD-10-CM | POA: Diagnosis not present

## 2018-02-19 ENCOUNTER — Other Ambulatory Visit: Payer: Self-pay | Admitting: Unknown Physician Specialty

## 2018-02-21 NOTE — Telephone Encounter (Signed)
Synthroid 75 mcg refill request  LOV 05/30/17 with Gabriel Cirriheryl Wicker      Was a no show for her appt on 11/28/17.   No future appts noted.  [id number] - [place], [place]

## 2018-02-23 ENCOUNTER — Encounter: Payer: Self-pay | Admitting: Unknown Physician Specialty

## 2018-02-23 NOTE — Telephone Encounter (Signed)
LVM for pt to call back to schedule an appt.  

## 2018-02-23 NOTE — Telephone Encounter (Signed)
Letter printed to mail for fu

## 2018-02-26 ENCOUNTER — Ambulatory Visit (INDEPENDENT_AMBULATORY_CARE_PROVIDER_SITE_OTHER): Payer: Medicare HMO | Admitting: Unknown Physician Specialty

## 2018-02-26 ENCOUNTER — Encounter: Payer: Self-pay | Admitting: Unknown Physician Specialty

## 2018-02-26 VITALS — BP 122/73 | HR 75 | Temp 98.3°F | Ht <= 58 in | Wt 125.4 lb

## 2018-02-26 DIAGNOSIS — I10 Essential (primary) hypertension: Secondary | ICD-10-CM

## 2018-02-26 DIAGNOSIS — R05 Cough: Secondary | ICD-10-CM | POA: Diagnosis not present

## 2018-02-26 DIAGNOSIS — E039 Hypothyroidism, unspecified: Secondary | ICD-10-CM

## 2018-02-26 DIAGNOSIS — R5383 Other fatigue: Secondary | ICD-10-CM | POA: Diagnosis not present

## 2018-02-26 DIAGNOSIS — R059 Cough, unspecified: Secondary | ICD-10-CM

## 2018-02-26 DIAGNOSIS — E785 Hyperlipidemia, unspecified: Secondary | ICD-10-CM | POA: Diagnosis not present

## 2018-02-26 MED ORDER — METOPROLOL SUCCINATE ER 50 MG PO TB24: 50.0000 mg | ORAL_TABLET | Freq: Every day | ORAL | 1 refills | Status: DC

## 2018-02-26 MED ORDER — LEVOTHYROXINE SODIUM 75 MCG PO TABS: 75.0000 ug | ORAL_TABLET | Freq: Every day | ORAL | 0 refills | Status: DC

## 2018-02-26 NOTE — Assessment & Plan Note (Signed)
Check labs today and adjust statin accordingly 

## 2018-02-26 NOTE — Progress Notes (Signed)
BP 122/73   Pulse 75   Temp 98.3 F (36.8 C) (Oral)   Ht 4\' 10"  (1.473 m)   Wt 125 lb 6.4 oz (56.9 kg)   LMP  (LMP Unknown)   SpO2 92%   BMI 26.21 kg/m    Subjective:    Patient ID: Savannah Whitaker, female    DOB: [birth year], 67 y.o.   MRN: [id number]  HPI: Savannah Whitaker is a 67 y.o. female  Chief Complaint  Patient presents with  . Depression  . Hypertension  . Hypothyroidism   Hypertension Using medications without difficulty Average home BPs not checking   No problems or lightheadedness No chest pain with exertion or shortness of breath No Edema  Hyperlipidemia Using medications without problems: No Muscle aches  Diet compliance:Exercise: Regular diet and exercise  Hypothyroid Feels she has gained some weight.  Feeling lower in energy.    Depression Besides complaints of fatigue.  Doing well with present medications.   Depression screen [facility] 02/26/2018 05/31/2017 02/28/2017 09/22/2015  Decreased Interest 1 0 0 0  Down, Depressed, Hopeless 0 0 0 0  PHQ - 2 Score 1 0 0 0  Altered sleeping 0 0 0 1  Tired, decreased energy 1 0 0 1  Change in appetite 1 0 0 1  Feeling bad or failure about yourself  0 0 0 0  Trouble concentrating 0 0 0 0  Moving slowly or fidgety/restless 0 0 0 0  Suicidal thoughts 0 0 0 0  PHQ-9 Score 3 0 0 3   Cough Pt with cough that seems to come and go.  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  Last CT noted changes associated with emphysema  Relevant past medical, surgical, family and social history reviewed and updated as indicated. Interim medical history since our last visit reviewed. Allergies and medications reviewed and updated.  Review of Systems  Constitutional: Negative.   HENT: Negative.   Eyes: Negative.   Respiratory: Negative.   Cardiovascular: Negative.   Gastrointestinal: Negative.   Endocrine: Negative.     Genitourinary: Negative.   Musculoskeletal: Negative.   Skin: Negative.   Allergic/Immunologic: Negative.   Neurological: Negative.   Hematological: Negative.   Psychiatric/Behavioral: Negative.     Per HPI unless specifically indicated above     Objective:    BP 122/73   Pulse 75   Temp 98.3 F (36.8 C) (Oral)   Ht 4\' 10"  (1.473 m)   Wt 125 lb 6.4 oz (56.9 kg)   LMP  (LMP Unknown)   SpO2 92%   BMI 26.21 kg/m   Wt Readings from Last 3 Encounters:  02/26/18 125 lb 6.4 oz (56.9 kg)  08/14/17 122 lb 14.4 oz (55.7 kg)  05/31/17 118 lb 9.6 oz (53.8 kg)    Physical Exam  Constitutional: She is oriented to person, place, and time. She appears well-developed and well-nourished. No distress.  HENT:  Head: Normocephalic and atraumatic.  Eyes: Conjunctivae and lids are normal. Right eye exhibits no discharge. Left eye exhibits no discharge. No scleral icterus.  Neck: Normal range of motion. Neck supple. No JVD present. Carotid bruit is not present.  Cardiovascular: Normal rate, regular rhythm and normal heart sounds.  Pulmonary/Chest: Effort normal. She has rhonchi.  Abdominal: Normal appearance. There is no splenomegaly or hepatomegaly.  Musculoskeletal: Normal range of motion.  Neurological: She is alert and  oriented to person, place, and time.  Skin: Skin is warm, dry and intact. No rash noted. No pallor.  Psychiatric: She has a normal mood and affect. Her behavior is normal. Judgment and thought content normal.    Results for orders placed or performed in visit on 08/14/17  Veritor Flu A/B Waived  Result Value Ref Range   Influenza A Negative Negative   Influenza B Negative Negative      Assessment & Plan:   Problem List Items Addressed This Visit      Unprioritized   Hyperlipidemia    Check labs today and adjust statin accordingly      Relevant Medications   metoprolol succinate (TOPROL-XL) 50 MG 24 hr tablet   Other Relevant Orders   Lipid Panel w/o  Chol/HDL Ratio   Hypertension    Stable, continue present medications.        Relevant Medications   metoprolol succinate (TOPROL-XL) 50 MG 24 hr tablet   Other Relevant Orders   Comprehensive metabolic panel   Hypothyroidism    Fatigue and weight gain.  Check TSH and free T4 today      Relevant Medications   levothyroxine (SYNTHROID, LEVOTHROID) 75 MCG tablet   metoprolol succinate (TOPROL-XL) 50 MG 24 hr tablet   Other Relevant Orders   T4, free   TSH    Other Visit Diagnoses    Fatigue, unspecified type    -  Primary   Relevant Orders   CBC with Differential/Platelet   Cough       Order chest x-ray.  Unable to do a spirometry due to equipment issues   Relevant Orders   DG Chest 2 View   Spirometry with Graph              Follow up plan: Return in about 1 month (around 03/26/2018) for physical and spirometry.

## 2018-02-26 NOTE — Assessment & Plan Note (Signed)
Stable, continue present medications.   

## 2018-02-26 NOTE — Assessment & Plan Note (Addendum)
Fatigue and weight gain.  Check TSH and free T4 today

## 2018-02-27 ENCOUNTER — Other Ambulatory Visit: Payer: Self-pay | Admitting: Unknown Physician Specialty

## 2018-02-27 DIAGNOSIS — E785 Hyperlipidemia, unspecified: Secondary | ICD-10-CM

## 2018-02-27 LAB — LIPID PANEL W/O CHOL/HDL RATIO
CHOLESTEROL TOTAL: 176 mg/dL (ref 100–199)
HDL: 31 mg/dL — ABNORMAL LOW (ref >39)
Triglycerides: 460 mg/dL — ABNORMAL HIGH (ref 0–149)

## 2018-02-27 LAB — COMPREHENSIVE METABOLIC PANEL
ALT: 12 IU/L (ref 0–32)
AST: 15 IU/L (ref 0–40)
Albumin/Globulin Ratio: 1.6 (ref 1.2–2.2)
Albumin: 4.2 g/dL (ref 3.6–4.8)
Alkaline Phosphatase: 59 IU/L (ref 39–117)
BUN / CREAT RATIO: 19 (ref 12–28)
BUN: 14 mg/dL (ref 8–27)
Bilirubin Total: 0.3 mg/dL (ref 0.0–1.2)
CALCIUM: 8.8 mg/dL (ref 8.7–10.3)
CO2: 22 mmol/L (ref 20–29)
CREATININE: 0.74 mg/dL (ref 0.57–1.00)
Chloride: 103 mmol/L (ref 96–106)
GFR calc non Af Amer: 85 mL/min/{1.73_m2} (ref >59)
GFR, EST AFRICAN AMERICAN: 98 mL/min/{1.73_m2} (ref >59)
Globulin, Total: 2.7 g/dL (ref 1.5–4.5)
Glucose: 73 mg/dL (ref 65–99)
Potassium: 3.5 mmol/L (ref 3.5–5.2)
Sodium: 142 mmol/L (ref 134–144)
Total Protein: 6.9 g/dL (ref 6.0–8.5)

## 2018-02-27 LAB — CBC WITH DIFFERENTIAL/PLATELET
BASOS ABS: 0 10*3/uL (ref 0.0–0.2)
Basos: 0 %
EOS (ABSOLUTE): 0.3 10*3/uL (ref 0.0–0.4)
Eos: 3 %
Hematocrit: 37.2 % (ref 34.0–46.6)
Hemoglobin: 12.5 g/dL (ref 11.1–15.9)
IMMATURE GRANS (ABS): 0 10*3/uL (ref 0.0–0.1)
Immature Granulocytes: 0 %
LYMPHS: 29 %
Lymphocytes Absolute: 2.6 10*3/uL (ref 0.7–3.1)
MCH: 30.3 pg (ref 26.6–33.0)
MCHC: 33.6 g/dL (ref 31.5–35.7)
MCV: 90 fL (ref 79–97)
Monocytes Absolute: 0.5 10*3/uL (ref 0.1–0.9)
Monocytes: 5 %
NEUTROS ABS: 5.7 10*3/uL (ref 1.4–7.0)
NEUTROS PCT: 63 %
PLATELETS: 256 10*3/uL (ref 150–450)
RBC: 4.12 x10E6/uL (ref 3.77–5.28)
RDW: 14.5 % (ref 12.3–15.4)
WBC: 9.1 10*3/uL (ref 3.4–10.8)

## 2018-02-27 LAB — TSH: TSH: 0.552 u[IU]/mL (ref 0.450–4.500)

## 2018-02-27 LAB — T4, FREE: Free T4: 1.1 ng/dL (ref 0.82–1.77)

## 2018-03-05 ENCOUNTER — Telehealth: Payer: Self-pay | Admitting: *Deleted

## 2018-03-05 NOTE — Telephone Encounter (Signed)
Patient called r/t CT screening of lung due at this time.  Patient voiced understanding and agreement with CT screening.  Appointment sent to scheduling for future appointment.    

## 2018-03-06 ENCOUNTER — Telehealth: Payer: Self-pay | Admitting: *Deleted

## 2018-03-06 DIAGNOSIS — Z122 Encounter for screening for malignant neoplasm of respiratory organs: Secondary | ICD-10-CM

## 2018-03-06 DIAGNOSIS — Z87891 Personal history of nicotine dependence: Secondary | ICD-10-CM

## 2018-03-06 NOTE — Telephone Encounter (Signed)
Patient has been notified that annual lung cancer screening low dose CT scan is due currently or will be in near future. Confirmed that patient is within the age range of 55-77, and asymptomatic, (no signs or symptoms of lung cancer). Patient denies illness that would prevent curative treatment for lung cancer if found. Verified smoking history, (current, 41 pack year). The shared decision making visit was done 03/14/17. Patient is agreeable for CT scan being scheduled.

## 2018-03-14 ENCOUNTER — Ambulatory Visit
Admission: RE | Admit: 2018-03-14 | Discharge: 2018-03-14 | Disposition: A | Discharge status: Home / Self Care | Payer: Medicare HMO | Source: Ambulatory Visit | Attending: Oncology | Admitting: Oncology

## 2018-03-14 DIAGNOSIS — R69 Illness, unspecified: Secondary | ICD-10-CM | POA: Diagnosis not present

## 2018-03-14 DIAGNOSIS — I700 Atherosclerosis of aorta: Secondary | ICD-10-CM | POA: Insufficient documentation

## 2018-03-14 DIAGNOSIS — Z122 Encounter for screening for malignant neoplasm of respiratory organs: Secondary | ICD-10-CM

## 2018-03-14 DIAGNOSIS — I2510 Atherosclerotic heart disease of native coronary artery without angina pectoris: Secondary | ICD-10-CM | POA: Insufficient documentation

## 2018-03-14 DIAGNOSIS — J439 Emphysema, unspecified: Secondary | ICD-10-CM | POA: Insufficient documentation

## 2018-03-14 DIAGNOSIS — Z87891 Personal history of nicotine dependence: Secondary | ICD-10-CM | POA: Diagnosis not present

## 2018-03-20 ENCOUNTER — Encounter: Payer: Self-pay | Admitting: *Deleted

## 2018-03-29 ENCOUNTER — Encounter: Payer: Self-pay | Admitting: Physician Assistant

## 2018-03-29 ENCOUNTER — Ambulatory Visit (INDEPENDENT_AMBULATORY_CARE_PROVIDER_SITE_OTHER): Payer: Medicare HMO

## 2018-03-29 ENCOUNTER — Ambulatory Visit (INDEPENDENT_AMBULATORY_CARE_PROVIDER_SITE_OTHER): Payer: Medicare HMO | Admitting: Physician Assistant

## 2018-03-29 VITALS — BP 118/62 | HR 70 | Temp 97.8°F | Resp 16 | Ht <= 58 in | Wt 126.1 lb

## 2018-03-29 VITALS — BP 118/62 | HR 70 | Temp 97.8°F | Ht <= 58 in | Wt 126.1 lb

## 2018-03-29 DIAGNOSIS — Z1231 Encounter for screening mammogram for malignant neoplasm of breast: Secondary | ICD-10-CM | POA: Diagnosis not present

## 2018-03-29 DIAGNOSIS — E785 Hyperlipidemia, unspecified: Secondary | ICD-10-CM

## 2018-03-29 DIAGNOSIS — E039 Hypothyroidism, unspecified: Secondary | ICD-10-CM | POA: Diagnosis not present

## 2018-03-29 DIAGNOSIS — R059 Cough, unspecified: Secondary | ICD-10-CM

## 2018-03-29 DIAGNOSIS — Z5320 Procedure and treatment not carried out because of patient's decision for unspecified reasons: Secondary | ICD-10-CM

## 2018-03-29 DIAGNOSIS — F419 Anxiety disorder, unspecified: Secondary | ICD-10-CM

## 2018-03-29 DIAGNOSIS — Z1239 Encounter for other screening for malignant neoplasm of breast: Secondary | ICD-10-CM

## 2018-03-29 DIAGNOSIS — J439 Emphysema, unspecified: Secondary | ICD-10-CM

## 2018-03-29 DIAGNOSIS — Z0000 Encounter for general adult medical examination without abnormal findings: Secondary | ICD-10-CM | POA: Diagnosis not present

## 2018-03-29 DIAGNOSIS — R739 Hyperglycemia, unspecified: Secondary | ICD-10-CM | POA: Diagnosis not present

## 2018-03-29 DIAGNOSIS — J432 Centrilobular emphysema: Secondary | ICD-10-CM | POA: Insufficient documentation

## 2018-03-29 DIAGNOSIS — R05 Cough: Secondary | ICD-10-CM | POA: Diagnosis not present

## 2018-03-29 DIAGNOSIS — I10 Essential (primary) hypertension: Secondary | ICD-10-CM

## 2018-03-29 DIAGNOSIS — R69 Illness, unspecified: Secondary | ICD-10-CM | POA: Diagnosis not present

## 2018-03-29 DIAGNOSIS — E781 Pure hyperglyceridemia: Secondary | ICD-10-CM | POA: Diagnosis not present

## 2018-03-29 MED ORDER — AMLODIPINE BESYLATE 2.5 MG PO TABS: 2.5000 mg | ORAL_TABLET | Freq: Every day | ORAL | 0 refills | Status: DC

## 2018-03-29 MED ORDER — ATORVASTATIN CALCIUM 20 MG PO TABS: 20.0000 mg | ORAL_TABLET | Freq: Every day | ORAL | 0 refills | Status: DC

## 2018-03-29 NOTE — Patient Instructions (Signed)
[name] , Thank you for taking time to come for your Medicare Wellness Visit. I appreciate your ongoing commitment to your health goals. Please review the following plan we discussed and let me know if I can assist you in the future.   Screening recommendations/referrals: Colonoscopy: declined  Mammogram: declined Bone Density: declined Recommended yearly ophthalmology/optometry visit for glaucoma screening and checkup Recommended yearly dental visit for hygiene and checkup  Vaccinations: Influenza vaccine: due 05/2018 Pneumococcal vaccine: declined Tdap vaccine: due, check with your insurance company for coverage  Shingles vaccine: shingrix eligible, check with your insurance company for coverage     Advanced directives: Advance directive discussed with you today. Even though you declined this today please call our office should you change your mind and we can give you the proper paperwork for you to fill out.  Conditions/risks identified: Smoking cessation discussed  Next appointment: Follow up in one year for your annual wellness exam.    Preventive Care 65 Years and Older, Female Preventive care refers to lifestyle choices and visits with your health care provider that can promote health and wellness. What does preventive care include?  A yearly physical exam. This is also called an annual well check.  Dental exams once or twice a year.  Routine eye exams. Ask your health care provider how often you should have your eyes checked.  Personal lifestyle choices, including:  Daily care of your teeth and gums.  Regular physical activity.  Eating a healthy diet.  Avoiding tobacco and drug use.  Limiting alcohol use.  Practicing safe sex.  Taking low-dose aspirin every day.  Taking vitamin and mineral supplements as recommended by your health care provider. What happens during an annual well check? The services and screenings done by your health care provider during  your annual well check will depend on your age, overall health, lifestyle risk factors, and family history of disease. Counseling  Your health care provider may ask you questions about your:  Alcohol use.  Tobacco use.  Drug use.  Emotional well-being.  Home and relationship well-being.  Sexual activity.  Eating habits.  History of falls.  Memory and ability to understand (cognition).  Work and work Astronomerenvironment.  Reproductive health. Screening  You may have the following tests or measurements:  Height, weight, and BMI.  Blood pressure.  Lipid and cholesterol levels. These may be checked every 5 years, or more frequently if you are over 67 years old.  Skin check.  Lung cancer screening. You may have this screening every year starting at age 67 if you have a 30-pack-year history of smoking and currently smoke or have quit within the past 15 years.  Fecal occult blood test (FOBT) of the stool. You may have this test every year starting at age 67.  Flexible sigmoidoscopy or colonoscopy. You may have a sigmoidoscopy every 5 years or a colonoscopy every 10 years starting at age 67.  Hepatitis C blood test.  Hepatitis B blood test.  Sexually transmitted disease (STD) testing.  Diabetes screening. This is done by checking your blood sugar (glucose) after you have not eaten for a while (fasting). You may have this done every 1-3 years.  Bone density scan. This is done to screen for osteoporosis. You may have this done starting at age 67.  Mammogram. This may be done every 1-2 years. Talk to your health care provider about how often you should have regular mammograms. Talk with your health care provider about your test results, treatment  options, and if necessary, the need for more tests. Vaccines  Your health care provider may recommend certain vaccines, such as:  Influenza vaccine. This is recommended every year.  Tetanus, diphtheria, and acellular pertussis (Tdap,  Td) vaccine. You may need a Td booster every 10 years.  Zoster vaccine. You may need this after age 55.  Pneumococcal 13-valent conjugate (PCV13) vaccine. One dose is recommended after age 20.  Pneumococcal polysaccharide (PPSV23) vaccine. One dose is recommended after age 5. Talk to your health care provider about which screenings and vaccines you need and how often you need them. This information is not intended to replace advice given to you by your health care provider. Make sure you discuss any questions you have with your health care provider. Document Released: 08/14/2015 Document Revised: 04/06/2016 Document Reviewed: 05/19/2015 Elsevier Interactive Patient Education  2017 [place] Prevention in the Home Falls can cause injuries. They can happen to people of all ages. There are many things you can do to make your home safe and to help prevent falls. What can I do on the outside of my home?  Regularly fix the edges of walkways and driveways and fix any cracks.  Remove anything that might make you trip as you walk through a door, such as a raised step or threshold.  Trim any bushes or trees on the path to your home.  Use bright outdoor lighting.  Clear any walking paths of anything that might make someone trip, such as rocks or tools.  Regularly check to see if handrails are loose or broken. Make sure that both sides of any steps have handrails.  Any raised decks and porches should have guardrails on the edges.  Have any leaves, snow, or ice cleared regularly.  Use sand or salt on walking paths during winter.  Clean up any spills in your garage right away. This includes oil or grease spills. What can I do in the bathroom?  Use night lights.  Install grab bars by the toilet and in the tub and shower. Do not use towel bars as grab bars.  Use non-skid mats or decals in the tub or shower.  If you need to sit down in the shower, use a plastic, non-slip  stool.  Keep the floor dry. Clean up any water that spills on the floor as soon as it happens.  Remove soap buildup in the tub or shower regularly.  Attach bath mats securely with double-sided non-slip rug tape.  Do not have throw rugs and other things on the floor that can make you trip. What can I do in the bedroom?  Use night lights.  Make sure that you have a light by your bed that is easy to reach.  Do not use any sheets or blankets that are too big for your bed. They should not hang down onto the floor.  Have a firm chair that has side arms. You can use this for support while you get dressed.  Do not have throw rugs and other things on the floor that can make you trip. What can I do in the kitchen?  Clean up any spills right away.  Avoid walking on wet floors.  Keep items that you use a lot in easy-to-reach places.  If you need to reach something above you, use a strong step stool that has a grab bar.  Keep electrical cords out of the way.  Do not use floor polish or wax that makes floors slippery.  If you must use wax, use non-skid floor wax.  Do not have throw rugs and other things on the floor that can make you trip. What can I do with my stairs?  Do not leave any items on the stairs.  Make sure that there are handrails on both sides of the stairs and use them. Fix handrails that are broken or loose. Make sure that handrails are as long as the stairways.  Check any carpeting to make sure that it is firmly attached to the stairs. Fix any carpet that is loose or worn.  Avoid having throw rugs at the top or bottom of the stairs. If you do have throw rugs, attach them to the floor with carpet tape.  Make sure that you have a light switch at the top of the stairs and the bottom of the stairs. If you do not have them, ask someone to add them for you. What else can I do to help prevent falls?  Wear shoes that:  Do not have high heels.  Have rubber bottoms.  Are  comfortable and fit you well.  Are closed at the toe. Do not wear sandals.  If you use a stepladder:  Make sure that it is fully opened. Do not climb a closed stepladder.  Make sure that both sides of the stepladder are locked into place.  Ask someone to hold it for you, if possible.  Clearly mark and make sure that you can see:  Any grab bars or handrails.  First and last steps.  Where the edge of each step is.  Use tools that help you move around (mobility aids) if they are needed. These include:  Canes.  Walkers.  Scooters.  Crutches.  Turn on the lights when you go into a dark area. Replace any light bulbs as soon as they burn out.  Set up your furniture so you have a clear path. Avoid moving your furniture around.  If any of your floors are uneven, fix them.  If there are any pets around you, be aware of where they are.  Review your medicines with your doctor. Some medicines can make you feel dizzy. This can increase your chance of falling. Ask your doctor what other things that you can do to help prevent falls. This information is not intended to replace advice given to you by your health care provider. Make sure you discuss any questions you have with your health care provider. Document Released: 05/14/2009 Document Revised: 12/24/2015 Document Reviewed: 08/22/2014 Elsevier Interactive Patient Education  2017 [employer].   Steps to Quit Smoking Smoking tobacco can be bad for your health. It can also affect almost every organ in your body. Smoking puts you and people around you at risk for many serious long-lasting (chronic) diseases. Quitting smoking is hard, but it is one of the best things that you can do for your health. It is never too late to quit. What are the benefits of quitting smoking? When you quit smoking, you lower your risk for getting serious diseases and conditions. They can include:  Lung cancer or lung disease.  Heart  disease.  Stroke.  Heart attack.  Not being able to have children (infertility).  Weak bones (osteoporosis) and broken bones (fractures).  If you have coughing, wheezing, and shortness of breath, those symptoms may get better when you quit. You may also get sick less often. If you are pregnant, quitting smoking can help to lower your chances of having a baby  of low birth weight. What can I do to help me quit smoking? Talk with your doctor about what can help you quit smoking. Some things you can do (strategies) include:  Quitting smoking totally, instead of slowly cutting back how much you smoke over a period of time.  Going to in-person counseling. You are more likely to quit if you go to many counseling sessions.  Using resources and support systems, such as: ? Agricultural engineer with a Veterinary surgeon. ? Phone quitlines. ? Automotive engineer. ? Support groups or group counseling. ? Text messaging programs. ? Mobile phone apps or applications.  Taking medicines. Some of these medicines may have nicotine in them. If you are pregnant or breastfeeding, do not take any medicines to quit smoking unless your doctor says it is okay. Talk with your doctor about counseling or other things that can help you.  Talk with your doctor about using more than one strategy at the same time, such as taking medicines while you are also going to in-person counseling. This can help make quitting easier. What things can I do to make it easier to quit? Quitting smoking might feel very hard at first, but there is a lot that you can do to make it easier. Take these steps:  Talk to your family and friends. Ask them to support and encourage you.  Call phone quitlines, reach out to support groups, or work with a Veterinary surgeon.  Ask people who smoke to not smoke around you.  Avoid places that make you want (trigger) to smoke, such as: ? Bars. ? Parties. ? Smoke-break areas at work.  Spend time with people  who do not smoke.  Lower the stress in your life. Stress can make you want to smoke. Try these things to help your stress: ? Getting regular exercise. ? Deep-breathing exercises. ? Yoga. ? Meditating. ? Doing a body scan. To do this, close your eyes, focus on one area of your body at a time from head to toe, and notice which parts of your body are tense. Try to relax the muscles in those areas.  Download or buy apps on your mobile phone or tablet that can help you stick to your quit plan. There are many free apps, such as QuitGuide from the [employer] developer for Disease Control and Prevention). You can find more support from smokefree.gov and other websites.  This information is not intended to replace advice given to you by your health care provider. Make sure you discuss any questions you have with your health care provider. Document Released: 05/14/2009 Document Revised: 03/15/2016 Document Reviewed: 12/02/2014 Elsevier Interactive Patient Education  2018 [employer].

## 2018-03-29 NOTE — Progress Notes (Signed)
Subjective:    Patient ID: Savannah Whitaker, female    DOB: 1951-01-26, 67 y.o.   MRN: [id number]  Savannah Whitaker is a 67 y.o. female presenting on 03/29/2018 for Annual Exam   HPI   Lives in [place] with husband of 17 years.  Patient has a son and a daughter.  Declines any and all preventive medication despite saying she would treat cancer if she found it.   Mammogram: declined; says she would treat breast cancer if she had it Colonoscopy: declined, family history of colon cancer in her brother at age 32  Prevnar: declined DEXA: Declined PAP: not indicated  Lung Cancer CT: 2019 no lung nodules, aortic atherosclerosis and emphysema. Spirometry machine is out of order today and she will need to get this at next visit, though emphysema is visible on her chest imaging   HTN: amlodipine 2.5 mg daily. Denies chest pain and SOB   BP Readings from Last 3 Encounters:  03/29/18 118/62  03/29/18 118/62  02/26/18 122/73     Hypothyroidism: 75 mcg daily, TSH 01/2018 was within normal limits  HLD: lipitor 20 mg daily. Due for repeat fasting lipid profile today as last one was not fasting.  Lipid Panel     Component Value Date/Time   CHOL 176 02/26/2018 1626   TRIG 460 (H) 02/26/2018 1626   HDL 31 (L) 02/26/2018 1626   LDLCALC Comment 02/26/2018 1626     Depression and Anxiety: Celexa 20 mg, doing well on this, denies any mood symptoms   Social History   Tobacco Use  . Smoking status: Current Every Day Smoker    Packs/day: 1.00    Years: 40.00    Pack years: 40.00    Types: Cigarettes  . Smokeless tobacco: Never Used  Substance Use Topics  . Alcohol use: Not on file    Comment: glass of wine few times a week   . Drug use: No    Review of Systems Per HPI unless specifically indicated above     Objective:    BP 118/62   Pulse 70   Temp 97.8 F (36.6 C) (Temporal)   Ht 4\' 9"  (1.448 m)   Wt 126 lb 1.6 oz (57.2 kg)   LMP  (LMP Unknown)   SpO2 97%   BMI  27.29 kg/m   Wt Readings from Last 3 Encounters:  03/29/18 126 lb 1.6 oz (57.2 kg)  03/29/18 126 lb 1.6 oz (57.2 kg)  03/14/18 122 lb (55.3 kg)    Physical Exam  Constitutional: She is oriented to person, place, and time. She appears well-developed and well-nourished.  Cardiovascular: Normal rate and regular rhythm.  Pulmonary/Chest: Effort normal and breath sounds normal.  Abdominal: Soft. Bowel sounds are normal.  Neurological: She is alert and oriented to person, place, and time.  Skin: Skin is warm and dry.  Psychiatric: She has a normal mood and affect. Her behavior is normal.   Results for orders placed or performed in visit on 02/26/18  T4, free  Result Value Ref Range   Free T4 1.10 0.82 - 1.77 ng/dL  TSH  Result Value Ref Range   TSH 0.552 0.450 - 4.500 uIU/mL  Comprehensive metabolic panel  Result Value Ref Range   Glucose 73 65 - 99 mg/dL   BUN 14 8 - 27 mg/dL   Creatinine, Ser 7.82 0.57 - 1.00 mg/dL   GFR calc non Af Amer 85 >59 mL/min/1.73   GFR calc Af Amer 98 >59  mL/min/1.73   BUN/Creatinine Ratio 19 12 - 28   Sodium 142 134 - 144 mmol/L   Potassium 3.5 3.5 - 5.2 mmol/L   Chloride 103 96 - 106 mmol/L   CO2 22 20 - 29 mmol/L   Calcium 8.8 8.7 - 10.3 mg/dL   Total Protein 6.9 6.0 - 8.5 g/dL   Albumin 4.2 3.6 - 4.8 g/dL   Globulin, Total 2.7 1.5 - 4.5 g/dL   Albumin/Globulin Ratio 1.6 1.2 - 2.2   Bilirubin Total 0.3 0.0 - 1.2 mg/dL   Alkaline Phosphatase 59 39 - 117 IU/L   AST 15 0 - 40 IU/L   ALT 12 0 - 32 IU/L  Lipid Panel w/o Chol/HDL Ratio  Result Value Ref Range   Cholesterol, Total 176 100 - 199 mg/dL   Triglycerides 161460 (H) 0 - 149 mg/dL   HDL 31 (L) >09>39 mg/dL   VLDL Cholesterol Cal Comment 5 - 40 mg/dL   LDL Calculated Comment 0 - 99 mg/dL  CBC with Differential/Platelet  Result Value Ref Range   WBC 9.1 3.4 - 10.8 x10E3/uL   RBC 4.12 3.77 - 5.28 x10E6/uL   Hemoglobin 12.5 11.1 - 15.9 g/dL   Hematocrit 60.437.2 54.034.0 - 46.6 %   MCV 90 79 - 97 fL    MCH 30.3 26.6 - 33.0 pg   MCHC 33.6 31.5 - 35.7 g/dL   RDW 98.114.5 19.112.3 - 47.815.4 %   Platelets 256 150 - 450 x10E3/uL   Neutrophils 63 Not Estab. %   Lymphs 29 Not Estab. %   Monocytes 5 Not Estab. %   Eos 3 Not Estab. %   Basos 0 Not Estab. %   Neutrophils Absolute 5.7 1.4 - 7.0 x10E3/uL   Lymphocytes Absolute 2.6 0.7 - 3.1 x10E3/uL   Monocytes Absolute 0.5 0.1 - 0.9 x10E3/uL   EOS (ABSOLUTE) 0.3 0.0 - 0.4 x10E3/uL   Basophils Absolute 0.0 0.0 - 0.2 x10E3/uL   Immature Granulocytes 0 Not Estab. %   Immature Grans (Abs) 0.0 0.0 - 0.1 x10E3/uL      Assessment & Plan:  1. Annual physical exam  Declines all preventive screenings and interventions despite my explanation that if she would treat cancer she should get these screenings. She said "I will think about it." I asked how long she will need to think about it and she repeats "I will think about it."  2. Hyperlipidemia, unspecified hyperlipidemia type  Will adjust pending labwork.  - atorvastatin (LIPITOR) 20 MG tablet; Take 1 tablet (20 mg total) by mouth daily.  Dispense: 90 tablet; Refill: 0 - Lipid Profile   5. Cough   6. Essential hypertension  Controlled, continue medications.  - amLODipine (NORVASC) 2.5 MG tablet; Take 1 tablet (2.5 mg total) by mouth daily.  Dispense: 90 tablet; Refill: 0  7. Hypothyroidism, unspecified type  TSH normal 01/2018 continue with 75 mcg synthroid.  8. Colon cancer screening declined   9. Breast cancer screening  Declined. Breast exam declined today.  10. Pulmonary emphysema, unspecified emphysema type (HCC)  Needs spirometry at next visit.   11. Chronic Anxiety  Continue Celexa 20 mg daily.     Follow up plan: Return in about 6 months (around 09/29/2018) for htn, hld, hypothyroidism, depression.   Osvaldo AngstAdriana Pollak, PA-C [facility]  [facility] 03/29/2018, 10:16 AM

## 2018-03-29 NOTE — Patient Instructions (Signed)
1-2 capfuls miralax daily  Docusate sodium stool softener.    Constipation, Adult Constipation is when a person:  Poops (has a bowel movement) fewer times in a week than normal.  Has a hard time pooping.  Has poop that is dry, hard, or bigger than normal.  Follow these instructions at home: Eating and drinking   Eat foods that have a lot of fiber, such as: ? Fresh fruits and vegetables. ? Whole grains. ? Beans.  Eat less of foods that are high in fat, low in fiber, or overly processed, such as: ? Jamaica[place] fries. ? Hamburgers. ? Cookies. ? Candy. ? Soda.  Drink enough fluid to keep your pee (urine) clear or pale yellow. General instructions  Exercise regularly or as told by your doctor.  Go to the restroom when you feel like you need to poop. Do not hold it in.  Take over-the-counter and prescription medicines only as told by your doctor. These include any fiber supplements.  Do pelvic floor retraining exercises, such as: ? Doing deep breathing while relaxing your lower belly (abdomen). ? Relaxing your pelvic floor while pooping.  Watch your condition for any changes.  Keep all follow-up visits as told by your doctor. This is important. Contact a doctor if:  You have pain that gets worse.  You have a fever.  You have not pooped for 4 days.  You throw up (vomit).  You are not hungry.  You lose weight.  You are bleeding from the anus.  You have thin, pencil-like poop (stool). Get help right away if:  You have a fever, and your symptoms suddenly get worse.  You leak poop or have blood in your poop.  Your belly feels hard or bigger than normal (is bloated).  You have very bad belly pain.  You feel dizzy or you faint. This information is not intended to replace advice given to you by your health care provider. Make sure you discuss any questions you have with your health care provider. Document Released: 01/04/2008 Document Revised: 02/05/2016  Document Reviewed: 01/06/2016 Elsevier Interactive Patient Education  2018 [employer]

## 2018-03-29 NOTE — Progress Notes (Signed)
Subjective:   Savannah Whitaker is a 67 y.o. female who presents for an Initial Medicare Annual Wellness Visit.  Review of Systems      Cardiac Risk Factors include: advanced age (>44men, >48 women);hypertension;dyslipidemia;smoking/ tobacco exposure     Objective:    Today's Vitals   03/29/18 0923  BP: 118/62  Pulse: 70  Resp: 16  Temp: 97.8 F (36.6 C)  TempSrc: Temporal  SpO2: 97%  Weight: 126 lb 1.6 oz (57.2 kg)  Height: 4\' 9"  (1.448 m)   Body mass index is 27.29 kg/m.  Advanced Directives 03/29/2018 07/08/2015  Does Patient Have a Medical Advance Directive? No No  Would patient like information on creating a medical advance directive? No - Patient declined Yes - Educational materials given    Current Medications (verified) Outpatient Encounter Medications as of 03/29/2018  Medication Sig  . amLODipine (NORVASC) 2.5 MG tablet TAKE 1 TABLET BY MOUTH EVERY DAY  . citalopram (CELEXA) 20 MG tablet TAKE 1 TABLET BY MOUTH EVERY DAY  . levothyroxine (SYNTHROID, LEVOTHROID) 75 MCG tablet Take 1 tablet (75 mcg total) by mouth daily.  . metoprolol succinate (TOPROL-XL) 50 MG 24 hr tablet Take 1 tablet (50 mg total) by mouth daily. Take with or immediately following a meal.  . atorvastatin (LIPITOR) 20 MG tablet Take 1 tablet (20 mg total) daily by mouth. (Patient not taking: Reported on 03/29/2018)   No facility-administered encounter medications on file as of 03/29/2018.     Allergies (verified) Patient has no known allergies.   History: Past Medical History:  Diagnosis Date  . Anxiety   . Hyperlipidemia   . Hypertension   . Hypertriglyceridemia   . IFG (impaired fasting glucose)   . Osteoarthritis   . Thyroid disease   . Tobacco use disorder   . Vitamin D deficiency    Past Surgical History:  Procedure Laterality Date  . CARPAL TUNNEL RELEASE Right   . TUBAL LIGATION     Family History  Problem Relation Age of Onset  . Lung cancer Mother   . Heart  disease Father   . Colon cancer Brother   . Alcohol abuse Brother   . Cancer Brother        mouth  . Breast cancer Neg Hx    Social History   Socioeconomic History  . Marital status: Married    Spouse name: Not on file  . Number of children: Not on file  . Years of education: Not on file  . Highest education level: High school graduate  Occupational History  . Not on file  Social Needs  . Financial resource strain: Not hard at all  . Food insecurity:    Worry: Never true    Inability: Never true  . Transportation needs:    Medical: No    Non-medical: No  Tobacco Use  . Smoking status: Current Every Day Smoker    Packs/day: 1.00    Years: 40.00    Pack years: 40.00    Types: Cigarettes  . Smokeless tobacco: Never Used  Substance and Sexual Activity  . Alcohol use: Not on file    Comment: glass of wine few times a week   . Drug use: No  . Sexual activity: Yes  Lifestyle  . Physical activity:    Days per week: 0 days    Minutes per session: 0 min  . Stress: Not at all  Relationships  . Social connections:    Talks on phone: More  than three times a week    Gets together: Three times a week    Attends religious service: More than 4 times per year    Active member of club or organization: No    Attends meetings of clubs or organizations: Never    Relationship status: Married  Other Topics Concern  . 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     Tobacco Counseling Ready to quit: Yes Counseling given: Yes   Clinical Intake:  Pre-visit preparation completed: Yes  Pain : No/denies pain     Nutritional Status: BMI 25 -29 Overweight Nutritional Risks: None Diabetes: No  How often do you need to have someone help you when you read instructions, pamphlets, or other written materials from your doctor or pharmacy?: 1 - Never What is the last grade level you completed in school?: high school    Interpreter Needed?: No  Information entered by :: Eligah Anello,LPN    Activities of Daily Living In your present state of health, do you have any difficulty performing the following activities: 03/29/2018  Hearing? N  Vision? N  Difficulty concentrating or making decisions? N  Walking or climbing stairs? N  Dressing or bathing? N  Doing errands, shopping? N  Preparing Food and eating ? N  Using the Toilet? N  In the past six months, have you accidently leaked urine? N  Do you have problems with loss of bowel control? N  Managing your Medications? N  Managing your Finances? N  Housekeeping or managing your Housekeeping? N  Some recent data might be hidden     Immunizations and Health Maintenance Immunization History  Administered Date(s) Administered  . Influenza, High Dose Seasonal PF 07/13/2017  . Td 06/10/2005   There are no preventive care reminders to display for this patient.  Patient Care Team: Gabriel CirriWicker, Cheryl, NP as PCP - General (Nurse Practitioner) Antonieta IbaGollan, Timothy J, MD as Consulting Physician (Cardiology)  Indicate any recent Medical Services you may have received from other than [facility] providers in the past year (date may be approximate).     Assessment:   This is a routine wellness examination for Savannah Whitaker.  Hearing/Vision screen Vision Screening Comments: Goes to Dr.Barber annually   Dietary issues and exercise activities discussed: Current Exercise Habits: The patient has a physically strenous job, but has no regular exercise apart from work.(walks a lot at her jobs ), Exercise limited by: None identified  Goals    . Quit Smoking     Smoking cessation discussed      Depression Screen PHQ 2/9 Scores 03/29/2018 03/29/2018 02/26/2018 05/31/2017 02/28/2017 09/22/2015  PHQ - 2 Score 0 0 1 0 0 0  PHQ- 9 Score 0 - 3 0 0 3    Fall Risk Fall Risk  03/29/2018 03/29/2018 02/28/2017  Falls in the past year? No No No    Is the patient's home free of loose throw  rugs in walkways, pet beds, electrical cords, etc?   yes      Grab bars in the bathroom? no      Handrails on the stairs?   no stairs       Adequate lighting?   yes  Timed Get Up and Go Performed Completed in 8 seconds with no use of assistive devices, steady gait. No intervention needed at this time.   Cognitive Function:     6CIT Screen 03/29/2018  What  Year? 0 points  What month? 0 points  What time? 0 points  Count back from 20 0 points  Months in reverse 0 points  Repeat phrase 0 points  Total Score 0    Screening Tests Health Maintenance  Topic Date Due  . INFLUENZA VACCINE  11/16/2018 (Originally 03/01/2018)  . MAMMOGRAM  03/30/2019 (Originally 07/23/2001)  . DEXA SCAN  03/30/2019 (Originally 07/23/2016)  . COLONOSCOPY  03/30/2019 (Originally 07/23/2001)  . TETANUS/TDAP  03/30/2019 (Originally 06/11/2015)  . PNA vac Low Risk Adult (1 of 2 - PCV13) 03/30/2019 (Originally 07/23/2016)  . Hepatitis C Screening  Completed   Declined pneumo vaccine today  Flu vaccine not available today   Qualifies for Shingles Vaccine?  Yes, discussed shingrix vaccine   Cancer Screenings: Lung: Low Dose CT Chest recommended if Age 30-80 years, 30 pack-year currently smoking OR have quit w/in 15years. Patient does qualify. Completed 03/14/2018 Breast: Up to date on Mammogram? No  declined Up to date of Bone Density/Dexa? No declined Colorectal: declined  Additional Screenings:  Hepatitis C Screening: completed 05/27/2015     Plan:    I have personally reviewed and addressed the Medicare Annual Wellness questionnaire and have noted the following in the patient's chart:  A. Medical and social history B. Use of alcohol, tobacco or illicit drugs  C. Current medications and supplements D. Functional ability and status E.  Nutritional status F.  Physical activity G. Advance directives H. List of other physicians I.  Hospitalizations, surgeries, and ER visits in previous 12 months J.   Vitals K. Screenings such as hearing and vision if needed, cognitive and depression L. Referrals and appointments   In addition, I have reviewed and discussed with patient certain preventive protocols, quality metrics, and best practice recommendations. A written personalized care plan for preventive services as well as general preventive health recommendations were provided to patient.   Signed,  Marin Roberts, LPN Nurse Health Advisor   Nurse Notes:none

## 2018-03-30 LAB — LIPID PANEL
Chol/HDL Ratio: 8.6 ratio — ABNORMAL HIGH (ref 0.0–4.4)
Cholesterol, Total: 267 mg/dL — ABNORMAL HIGH (ref 100–199)
HDL: 31 mg/dL — ABNORMAL LOW (ref >39)
Triglycerides: 667 mg/dL (ref 0–149)

## 2018-04-21 LAB — LDL CHOLESTEROL, DIRECT: LDL Direct: 69 mg/dL (ref 0–99)

## 2018-04-21 LAB — SPECIMEN STATUS REPORT

## 2018-05-26 ENCOUNTER — Other Ambulatory Visit: Payer: Self-pay | Admitting: Unknown Physician Specialty

## 2018-06-01 DIAGNOSIS — R69 Illness, unspecified: Secondary | ICD-10-CM | POA: Diagnosis not present

## 2018-06-29 ENCOUNTER — Other Ambulatory Visit: Payer: Self-pay | Admitting: Physician Assistant

## 2018-06-29 DIAGNOSIS — E785 Hyperlipidemia, unspecified: Secondary | ICD-10-CM

## 2018-07-09 ENCOUNTER — Other Ambulatory Visit: Payer: Self-pay

## 2018-07-09 DIAGNOSIS — E785 Hyperlipidemia, unspecified: Secondary | ICD-10-CM

## 2018-07-09 MED ORDER — ATORVASTATIN CALCIUM 20 MG PO TABS: 20.0000 mg | ORAL_TABLET | Freq: Every day | ORAL | 0 refills | Status: DC

## 2018-07-09 NOTE — Telephone Encounter (Signed)
Patient last seen 03/29/18 and has appointment 10/05/18.

## 2018-07-21 ENCOUNTER — Other Ambulatory Visit: Payer: Self-pay | Admitting: Physician Assistant

## 2018-07-21 DIAGNOSIS — I10 Essential (primary) hypertension: Secondary | ICD-10-CM

## 2018-08-26 ENCOUNTER — Other Ambulatory Visit: Payer: Self-pay | Admitting: Unknown Physician Specialty

## 2018-08-27 NOTE — Telephone Encounter (Signed)
Requested Prescriptions  Pending Prescriptions Disp Refills  . citalopram (CELEXA) 20 MG tablet [Pharmacy Med Name: CITALOPRAM HBR 20 MG TABLET] 90 tablet 1    Sig: TAKE 1 TABLET BY MOUTH EVERY DAY     Psychiatry:  Antidepressants - SSRI Passed - 08/26/2018 10:06 AM      Passed - Valid encounter within last 6 months    Recent Outpatient Visits          5 months ago Annual physical exam   [facility] Osvaldo Angst M, PA-C   6 months ago Fatigue, unspecified type   [facility] Gabriel Cirri, NP   1 year ago Cough   [facility] Gabriel Cirri, NP   1 year ago Hyperlipidemia, unspecified hyperlipidemia type   [facility] Gabriel Cirri, NP   1 year ago Essential hypertension   [facility] Gabriel Cirri, NP      Future Appointments            In 1 month Cannady, Dorie Rank, NP [employer], [facility]

## 2018-09-06 ENCOUNTER — Other Ambulatory Visit: Payer: Self-pay | Admitting: Unknown Physician Specialty

## 2018-09-06 NOTE — Telephone Encounter (Signed)
Filled until OV Requested Prescriptions  Pending Prescriptions Disp Refills  . metoprolol succinate (TOPROL-XL) 50 MG 24 hr tablet [Pharmacy Med Name: METOPROLOL SUCC ER 50 MG TAB] 90 tablet 0    Sig: TAKE 1 TABLET (50 MG TOTAL) BY MOUTH DAILY. TAKE WITH OR IMMEDIATELY FOLLOWING A MEAL.     Cardiovascular:  Beta Blockers Passed - 09/06/2018  4:41 AM      Passed - Last BP in normal range    BP Readings from Last 1 Encounters:  03/29/18 118/62         Passed - Last Heart Rate in normal range    Pulse Readings from Last 1 Encounters:  03/29/18 70         Passed - Valid encounter within last 6 months    Recent Outpatient Visits          5 months ago Annual physical exam   [facility] Osvaldo Angst M, PA-C   6 months ago Fatigue, unspecified type   [facility] Gabriel Cirri, NP   1 year ago Cough   [facility] Gabriel Cirri, NP   1 year ago Hyperlipidemia, unspecified hyperlipidemia type   [facility] Gabriel Cirri, NP   1 year ago Essential hypertension   [facility] Gabriel Cirri, NP      Future Appointments            In 4 weeks Cannady, Dorie Rank, NP [employer], [facility]

## 2018-10-04 ENCOUNTER — Encounter: Payer: Self-pay | Admitting: Nurse Practitioner

## 2018-10-05 ENCOUNTER — Ambulatory Visit: Payer: Medicare HMO | Admitting: Nurse Practitioner

## 2018-11-10 ENCOUNTER — Other Ambulatory Visit: Payer: Self-pay | Admitting: Nurse Practitioner

## 2018-11-10 DIAGNOSIS — E785 Hyperlipidemia, unspecified: Secondary | ICD-10-CM

## 2018-12-13 ENCOUNTER — Other Ambulatory Visit: Payer: Self-pay | Admitting: Unknown Physician Specialty

## 2019-01-05 ENCOUNTER — Other Ambulatory Visit: Payer: Self-pay | Admitting: Unknown Physician Specialty

## 2019-01-05 NOTE — Telephone Encounter (Signed)
Pt needs call to set up virtual visit. Pt has already received courtesy refill.

## 2019-01-07 NOTE — Telephone Encounter (Signed)
Patient needs appointment for further refills.  Virtual okay.

## 2019-01-14 ENCOUNTER — Other Ambulatory Visit: Payer: Self-pay | Admitting: Nurse Practitioner

## 2019-01-14 DIAGNOSIS — E785 Hyperlipidemia, unspecified: Secondary | ICD-10-CM

## 2019-02-04 ENCOUNTER — Other Ambulatory Visit: Payer: Self-pay | Admitting: Nurse Practitioner

## 2019-02-04 NOTE — Telephone Encounter (Signed)
Requested medication (s) are due for refill today: yes  Requested medication (s) are on the active medication list: yes  Last refill:    Future visit scheduled: no  Notes to clinic:  Last office visit 03/29/18

## 2019-02-17 ENCOUNTER — Other Ambulatory Visit: Payer: Self-pay | Admitting: Nurse Practitioner

## 2019-02-17 ENCOUNTER — Other Ambulatory Visit: Payer: Self-pay | Admitting: Unknown Physician Specialty

## 2019-02-17 NOTE — Telephone Encounter (Signed)
Requested Prescriptions  Pending Prescriptions Disp Refills  . citalopram (CELEXA) 20 MG tablet [Pharmacy Med Name: CITALOPRAM HBR 20 MG TABLET] 30 tablet 0    Sig: TAKE 1 TABLET BY MOUTH EVERY DAY     Psychiatry:  Antidepressants - SSRI Failed - 02/17/2019  9:52 AM      Failed - Valid encounter within last 6 months    Recent Outpatient Visits          10 months ago Annual physical exam   [facility] Carles Collet M, PA-C   11 months ago Fatigue, unspecified type   [facility] Kathrine Haddock, NP   1 year ago Cough   [facility] Kathrine Haddock, NP   1 year ago Hyperlipidemia, unspecified hyperlipidemia type   [facility] Kathrine Haddock, NP   1 year ago Essential hypertension   [place], [place], NP      Future Appointments            In 1 month Cannady, Barbaraann Faster, NP [employer], [facility]           Failed - Completed PHQ-2 or PHQ-9 in the last 360 days.

## 2019-02-17 NOTE — Telephone Encounter (Signed)
30 day courtesy refill Requested Prescriptions  Pending Prescriptions Disp Refills  . levothyroxine (SYNTHROID) 75 MCG tablet [Pharmacy Med Name: LEVOTHYROXINE 75 MCG TABLET] 30 tablet 0    Sig: TAKE 1 TABLET BY MOUTH EVERY DAY     Endocrinology:  Hypothyroid Agents Failed - 02/17/2019  9:52 AM      Failed - TSH needs to be rechecked within 3 months after an abnormal result. Refill until TSH is due.      Passed - TSH in normal range and within 360 days    TSH  Date Value Ref Range Status  02/26/2018 0.552 0.450 - 4.500 uIU/mL Final         Passed - Valid encounter within last 12 months    Recent Outpatient Visits          10 months ago Annual physical exam   [facility] Carles Collet M, PA-C   11 months ago Fatigue, unspecified type   [facility] Kathrine Haddock, NP   1 year ago Cough   [place], NP   1 year ago Hyperlipidemia, unspecified hyperlipidemia type   [facility] Kathrine Haddock, NP   1 year ago Essential hypertension   [place], [place], NP      Future Appointments            In 1 month Cannady, Barbaraann Faster, NP [employer], [facility]

## 2019-03-15 ENCOUNTER — Other Ambulatory Visit: Payer: Self-pay | Admitting: Nurse Practitioner

## 2019-03-15 DIAGNOSIS — E785 Hyperlipidemia, unspecified: Secondary | ICD-10-CM

## 2019-03-15 NOTE — Telephone Encounter (Signed)
Requested Prescriptions  Pending Prescriptions Disp Refills  . atorvastatin (LIPITOR) 20 MG tablet [Pharmacy Med Name: ATORVASTATIN 20 MG TABLET] 30 tablet 0    Sig: TAKE 1 TABLET BY MOUTH EVERY DAY     Cardiovascular:  Antilipid - Statins Failed - 03/15/2019  2:33 PM      Failed - Total Cholesterol in normal range and within 360 days    Cholesterol, Total  Date Value Ref Range Status  03/29/2018 267 (H) 100 - 199 mg/dL Final         Failed - HDL in normal range and within 360 days    HDL  Date Value Ref Range Status  03/29/2018 31 (L) >39 mg/dL Final         Failed - Triglycerides in normal range and within 360 days    Triglycerides  Date Value Ref Range Status  03/29/2018 667 (HH) 0 - 149 mg/dL Final         Passed - LDL in normal range and within 360 days    LDL Calculated  Date Value Ref Range Status  03/29/2018 Comment 0 - 99 mg/dL Final    Comment:    Triglyceride result indicated is too high for an accurate LDL cholesterol estimation.          Passed - Patient is not pregnant      Passed - Valid encounter within last 12 months    Recent Outpatient Visits          11 months ago Annual physical exam   [facility] Trinna Post, PA-C   1 year ago Fatigue, unspecified type   [facility] Kathrine Haddock, NP   1 year ago Cough   [place], NP   1 year ago Hyperlipidemia, unspecified hyperlipidemia type   [facility] Kathrine Haddock, NP   2 years ago Essential hypertension   [place] Kathrine Haddock, NP      Future Appointments            In 2 weeks Cannady, Barbaraann Faster, NP [employer], [facility]

## 2019-03-20 ENCOUNTER — Other Ambulatory Visit: Payer: Self-pay | Admitting: Nurse Practitioner

## 2019-03-20 NOTE — Telephone Encounter (Signed)
Please advise on refill request, pt does have a pending OV in September

## 2019-03-20 NOTE — Telephone Encounter (Signed)
Routing to provider  

## 2019-03-26 ENCOUNTER — Telehealth: Payer: Self-pay | Admitting: *Deleted

## 2019-03-26 NOTE — Telephone Encounter (Signed)
Left message for patient to notify them that it is time to schedule annual low dose lung cancer screening CT scan. Instructed patient to call back to verify information prior to the scan being scheduled.  

## 2019-04-02 ENCOUNTER — Encounter: Payer: Medicare HMO | Admitting: Nurse Practitioner

## 2019-04-07 ENCOUNTER — Other Ambulatory Visit: Payer: Self-pay | Admitting: Nurse Practitioner

## 2019-04-16 ENCOUNTER — Other Ambulatory Visit: Payer: Self-pay | Admitting: Nurse Practitioner

## 2019-04-16 DIAGNOSIS — E785 Hyperlipidemia, unspecified: Secondary | ICD-10-CM

## 2019-04-16 NOTE — Telephone Encounter (Signed)
Routing to provider  

## 2019-04-16 NOTE — Telephone Encounter (Signed)
Requested medication (s) are due for refill today: yes  Requested medication (s) are on the active medication list: yes  Last refill:  03/15/2019  Future visit scheduled: yes  Notes to clinic:  Review for refill   Requested Prescriptions  Pending Prescriptions Disp Refills   atorvastatin (LIPITOR) 20 MG tablet [Pharmacy Med Name: ATORVASTATIN 20 MG TABLET] 30 tablet 0    Sig: TAKE 1 TABLET BY MOUTH EVERY DAY     Cardiovascular:  Antilipid - Statins Failed - 04/16/2019 10:35 AM      Failed - Total Cholesterol in normal range and within 360 days    Cholesterol, Total  Date Value Ref Range Status  03/29/2018 267 (H) 100 - 199 mg/dL Final         Failed - LDL in normal range and within 360 days    LDL Calculated  Date Value Ref Range Status  03/29/2018 Comment 0 - 99 mg/dL Final    Comment:    Triglyceride result indicated is too high for an accurate LDL cholesterol estimation.          Failed - HDL in normal range and within 360 days    HDL  Date Value Ref Range Status  03/29/2018 31 (L) >39 mg/dL Final         Failed - Triglycerides in normal range and within 360 days    Triglycerides  Date Value Ref Range Status  03/29/2018 667 (HH) 0 - 149 mg/dL Final         Failed - Valid encounter within last 12 months    Recent Outpatient Visits          1 year ago Annual physical exam   [facility] Trinna Post, PA-C   1 year ago Fatigue, unspecified type   [facility] Kathrine Haddock, NP   1 year ago Cough   [facility] Kathrine Haddock, NP   1 year ago Hyperlipidemia, unspecified hyperlipidemia type   [facility] Kathrine Haddock, NP   2 years ago Essential hypertension   [place] [place], NP      Future Appointments            In 1 month Cannady, Barbaraann Faster, NP [employer], [place] - Patient is not pregnant       levothyroxine (SYNTHROID) 53 MCG tablet  [Pharmacy Med Name: LEVOTHYROXINE 75 MCG TABLET] 30 tablet 0    Sig: TAKE 1 TABLET BY MOUTH EVERY DAY     Endocrinology:  Hypothyroid Agents Failed - 04/16/2019 10:35 AM      Failed - TSH needs to be rechecked within 3 months after an abnormal result. Refill until TSH is due.      Failed - TSH in normal range and within 360 days    TSH  Date Value Ref Range Status  02/26/2018 0.552 0.450 - 4.500 uIU/mL Final         Failed - Valid encounter within last 12 months    Recent Outpatient Visits          1 year ago Annual physical exam   [facility] Trinna Post, PA-C   1 year ago Fatigue, unspecified type   [facility] Kathrine Haddock, NP   1 year ago Cough   [place], NP   1 year ago Hyperlipidemia, unspecified hyperlipidemia type   [facility]  Gabriel CirriWicker, Cheryl, NP   2 years ago Essential hypertension   [facility] Gabriel CirriWicker, Cheryl, NP      Future Appointments            In 1 month Cannady, Dorie RankJolene T, NP [facility]

## 2019-05-06 ENCOUNTER — Telehealth: Payer: Self-pay | Admitting: *Deleted

## 2019-05-06 DIAGNOSIS — Z87891 Personal history of nicotine dependence: Secondary | ICD-10-CM

## 2019-05-06 DIAGNOSIS — Z122 Encounter for screening for malignant neoplasm of respiratory organs: Secondary | ICD-10-CM

## 2019-05-06 NOTE — Telephone Encounter (Signed)
Patient has been notified that annual lung cancer screening low dose CT scan is due currently or will be in near future. Confirmed that patient is within the age range of 55-77, and asymptomatic, (no signs or symptoms of lung cancer). Patient denies illness that would prevent curative treatment for lung cancer if found. Verified smoking history, (current, 42 pack year). The shared decision making visit was done 03/14/17. Patient is agreeable for CT scan being scheduled.

## 2019-05-06 NOTE — Telephone Encounter (Signed)
Patient has been notified that annual lung cancer screening low dose CT scan is due currently or will be in near future. Confirmed that patient is within the age range of 55-77, and asymptomatic, (no signs or symptoms of lung cancer). Patient denies illness that would prevent curative treatment for lung cancer if found. Verified smoking history, (current, 42 pack year). The shared decision making visit was done 03/14/17. Patient is agreeable for CT scan being scheduled.  

## 2019-05-09 ENCOUNTER — Other Ambulatory Visit: Payer: Self-pay

## 2019-05-09 ENCOUNTER — Ambulatory Visit
Admission: RE | Admit: 2019-05-09 | Discharge: 2019-05-09 | Disposition: A | Discharge status: Home / Self Care | Payer: Medicare HMO | Source: Ambulatory Visit | Attending: Oncology | Admitting: Oncology

## 2019-05-09 DIAGNOSIS — Z87891 Personal history of nicotine dependence: Secondary | ICD-10-CM | POA: Insufficient documentation

## 2019-05-09 DIAGNOSIS — Z122 Encounter for screening for malignant neoplasm of respiratory organs: Secondary | ICD-10-CM | POA: Diagnosis not present

## 2019-05-09 DIAGNOSIS — R69 Illness, unspecified: Secondary | ICD-10-CM | POA: Diagnosis not present

## 2019-05-11 ENCOUNTER — Other Ambulatory Visit: Payer: Self-pay | Admitting: Nurse Practitioner

## 2019-05-11 DIAGNOSIS — E785 Hyperlipidemia, unspecified: Secondary | ICD-10-CM

## 2019-05-11 NOTE — Telephone Encounter (Signed)
Courtesy refill, appt 05/29/2019

## 2019-05-13 ENCOUNTER — Encounter: Payer: Self-pay | Admitting: *Deleted

## 2019-05-29 ENCOUNTER — Ambulatory Visit (INDEPENDENT_AMBULATORY_CARE_PROVIDER_SITE_OTHER): Payer: Medicare HMO | Admitting: Nurse Practitioner

## 2019-05-29 ENCOUNTER — Encounter: Payer: Self-pay | Admitting: Nurse Practitioner

## 2019-05-29 ENCOUNTER — Other Ambulatory Visit: Payer: Self-pay

## 2019-05-29 VITALS — BP 140/88 | HR 65 | Temp 98.4°F | Ht <= 58 in | Wt 131.2 lb

## 2019-05-29 DIAGNOSIS — Z23 Encounter for immunization: Secondary | ICD-10-CM

## 2019-05-29 DIAGNOSIS — J439 Emphysema, unspecified: Secondary | ICD-10-CM

## 2019-05-29 DIAGNOSIS — Z0000 Encounter for general adult medical examination without abnormal findings: Secondary | ICD-10-CM | POA: Diagnosis not present

## 2019-05-29 DIAGNOSIS — E039 Hypothyroidism, unspecified: Secondary | ICD-10-CM

## 2019-05-29 DIAGNOSIS — F17219 Nicotine dependence, cigarettes, with unspecified nicotine-induced disorders: Secondary | ICD-10-CM

## 2019-05-29 DIAGNOSIS — I700 Atherosclerosis of aorta: Secondary | ICD-10-CM | POA: Insufficient documentation

## 2019-05-29 DIAGNOSIS — I2510 Atherosclerotic heart disease of native coronary artery without angina pectoris: Secondary | ICD-10-CM

## 2019-05-29 DIAGNOSIS — E559 Vitamin D deficiency, unspecified: Secondary | ICD-10-CM | POA: Diagnosis not present

## 2019-05-29 DIAGNOSIS — E785 Hyperlipidemia, unspecified: Secondary | ICD-10-CM

## 2019-05-29 DIAGNOSIS — I10 Essential (primary) hypertension: Secondary | ICD-10-CM

## 2019-05-29 DIAGNOSIS — L989 Disorder of the skin and subcutaneous tissue, unspecified: Secondary | ICD-10-CM | POA: Diagnosis not present

## 2019-05-29 DIAGNOSIS — R69 Illness, unspecified: Secondary | ICD-10-CM | POA: Diagnosis not present

## 2019-05-29 DIAGNOSIS — F419 Anxiety disorder, unspecified: Secondary | ICD-10-CM

## 2019-05-29 MED ORDER — LEVOTHYROXINE SODIUM 75 MCG PO TABS: 75.0000 ug | ORAL_TABLET | Freq: Every day | ORAL | 3 refills | Status: DC

## 2019-05-29 MED ORDER — NYSTATIN 100000 UNIT/GM EX CREA: 1.0000 "application " | TOPICAL_CREAM | Freq: Two times a day (BID) | CUTANEOUS | 1 refills | Status: DC

## 2019-05-29 MED ORDER — METOPROLOL SUCCINATE ER 50 MG PO TB24: ORAL_TABLET | ORAL | 3 refills | Status: DC

## 2019-05-29 MED ORDER — AMLODIPINE BESYLATE 2.5 MG PO TABS: 2.5000 mg | ORAL_TABLET | Freq: Every day | ORAL | 0 refills | Status: DC

## 2019-05-29 MED ORDER — ATORVASTATIN CALCIUM 20 MG PO TABS: 20.0000 mg | ORAL_TABLET | Freq: Every day | ORAL | 3 refills | Status: DC

## 2019-05-29 NOTE — Assessment & Plan Note (Signed)
Chronic, ongoing with BP above goal today.  Will add on low doe Amlodipine, 2.5 MG, script sent. Continue Metoprolol.  Recommend checking BP daily at home and documenting for provider.  Return in 4 weeks to office.

## 2019-05-29 NOTE — Assessment & Plan Note (Signed)
Chronic, stable. Denies SI/HI.  Continue current medication regimen and adjust as needed.  Refills sent. 

## 2019-05-29 NOTE — Patient Instructions (Addendum)
Influenza (Flu) Vaccine (Inactivated or Recombinant): What You Need to Know 1. Why get vaccinated? Influenza vaccine can prevent influenza (flu). Flu is a contagious disease that spreads around the [place] every year, usually between October and May. Anyone can get the flu, but it is more dangerous for some people. Infants and young children, people 68 years of age and older, pregnant women, and people with certain health conditions or a weakened immune system are at greatest risk of flu complications. Pneumonia, bronchitis, sinus infections and ear infections are examples of flu-related complications. If you have a medical condition, such as heart disease, cancer or diabetes, flu can make it worse. Flu can cause fever and chills, sore throat, muscle aches, fatigue, cough, headache, and runny or stuffy nose. Some people may have vomiting and diarrhea, though this is more common in children than adults. Each year thousands of people in the [place] die from flu, and many more are hospitalized. Flu vaccine prevents millions of illnesses and flu-related visits to the doctor each year. 2. Influenza vaccine CDC recommends everyone 57 months of age and older get vaccinated every flu season. Children 6 months through 2 years of age may need 2 doses during a single flu season. Everyone else needs only 1 dose each flu season. It takes about 2 weeks for protection to develop after vaccination. There are many flu viruses, and they are always changing. Each year a new flu vaccine is made to protect against three or four viruses that are likely to cause disease in the upcoming flu season. Even when the vaccine doesn't exactly match these viruses, it may still provide some protection. Influenza vaccine does not cause flu. Influenza vaccine may be given at the same time as other vaccines. 3. Talk with your health care provider Tell your vaccine provider if the person getting the vaccine:  Has had an  allergic reaction after a previous dose of influenza vaccine, or has any severe, life-threatening allergies.  Has ever had Guillain-Barr Syndrome (also called GBS). In some cases, your health care provider may decide to postpone influenza vaccination to a future visit. People with minor illnesses, such as a cold, may be vaccinated. People who are moderately or severely ill should usually wait until they recover before getting influenza vaccine. Your health care provider can give you more information. 4. Risks of a vaccine reaction  Soreness, redness, and swelling where shot is given, fever, muscle aches, and headache can happen after influenza vaccine.  There may be a very small increased risk of Guillain-Barr Syndrome (GBS) after inactivated influenza vaccine (the flu shot). Young children who get the flu shot along with pneumococcal vaccine (PCV13), and/or DTaP vaccine at the same time might be slightly more likely to have a seizure caused by fever. Tell your health care provider if a child who is getting flu vaccine has ever had a seizure. People sometimes faint after medical procedures, including vaccination. Tell your provider if you feel dizzy or have vision changes or ringing in the ears. As with any medicine, there is a very remote chance of a vaccine causing a severe allergic reaction, other serious injury, or death. 5. What if there is a serious problem? An allergic reaction could occur after the vaccinated person leaves the clinic. If you see signs of a severe allergic reaction (hives, swelling of the face and throat, difficulty breathing, a fast heartbeat, dizziness, or weakness), call 9-1-1 and get the person to the nearest hospital. For other signs that  concern you, call your health care provider. Adverse reactions should be reported to the Vaccine Adverse Event Reporting System (VAERS). Your health care provider will usually file this report, or you can do it yourself. Visit the  VAERS website at www.vaers.LAgents.no or call 917-796-6501.VAERS is only for reporting reactions, and VAERS staff do not give medical advice. 6. The National Vaccine Injury Compensation Program The [employer] Vaccine Injury Compensation Program (VICP) is a federal program that was created to compensate people who may have been injured by certain vaccines. Visit the VICP website at SpiritualWord.at or call (832)757-1876 to learn about the program and about filing a claim. There is a time limit to file a claim for compensation. 7. How can I learn more?  Ask your healthcare provider.  Call your local or state health department.  Contact the Centers for Disease Control and Prevention (CDC): ? Call 641-846-8979 (1-800-CDC-INFO) or ? Visit CDC's BiotechRoom.com.cy Vaccine Information Statement (Interim) Inactivated Influenza Vaccine (03/15/2018) This information is not intended to replace advice given to you by your health care provider. Make sure you discuss any questions you have with your health care provider. Document Released: 05/12/2006 Document Revised: 11/06/2018 Document Reviewed: 03/19/2018 Elsevier Patient Education  2020 [employer].  Bupropion tablets (Depression/Mood Disorders) What is this medicine? BUPROPION (byoo PROE pee on) is used to treat depression. This medicine may be used for other purposes; ask your health care provider or pharmacist if you have questions. COMMON BRAND NAME(S): Wellbutrin What should I tell my health care provider before I take this medicine? They need to know if you have any of these conditions:  an eating disorder, such as anorexia or bulimia  bipolar disorder or psychosis  diabetes or high blood sugar, treated with medication  glaucoma  heart disease, previous heart attack, or irregular heart beat  head injury or brain tumor  high blood pressure  kidney or liver disease  seizures  suicidal thoughts or a previous suicide  attempt  Tourette's syndrome  weight loss  an unusual or allergic reaction to bupropion, other medicines, foods, dyes, or preservatives  breast-feeding  pregnant or trying to become pregnant How should I use this medicine? Take this medicine by mouth with a glass of water. Follow the directions on the prescription label. You can take it with or without food. If it upsets your stomach, take it with food. Take your medicine at regular intervals. Do not take your medicine more often than directed. Do not stop taking this medicine suddenly except upon the advice of your doctor. Stopping this medicine too quickly may cause serious side effects or your condition may worsen. A special MedGuide will be given to you by the pharmacist with each prescription and refill. Be sure to read this information carefully each time. Talk to your pediatrician regarding the use of this medicine in children. Special care may be needed. Overdosage: If you think you have taken too much of this medicine contact a poison control center or emergency room at once. NOTE: This medicine is only for you. Do not share this medicine with others. What if I miss a dose? If you miss a dose, take it as soon as you can. If it is less than four hours to your next dose, take only that dose and skip the missed dose. Do not take double or extra doses. What may interact with this medicine? Do not take this medicine with any of the following medications:  linezolid  MAOIs like Azilect, Carbex, Eldepryl, Marplan,  Nardil, and Parnate  methylene blue (injected into a vein)  other medicines that contain bupropion like Zyban This medicine may also interact with the following medications:  alcohol  certain medicines for anxiety or sleep  certain medicines for blood pressure like metoprolol, propranolol  certain medicines for depression or psychotic disturbances  certain medicines for HIV or AIDS like efavirenz, lopinavir,  nelfinavir, ritonavir  certain medicines for irregular heart beat like propafenone, flecainide  certain medicines for Parkinson's disease like amantadine, levodopa  certain medicines for seizures like carbamazepine, phenytoin, phenobarbital  cimetidine  clopidogrel  cyclophosphamide  digoxin  furazolidone  isoniazid  nicotine  orphenadrine  procarbazine  steroid medicines like prednisone or cortisone  stimulant medicines for attention disorders, weight loss, or to stay awake  tamoxifen  theophylline  thiotepa  ticlopidine  tramadol  warfarin This list may not describe all possible interactions. Give your health care provider a list of all the medicines, herbs, non-prescription drugs, or dietary supplements you use. Also tell them if you smoke, drink alcohol, or use illegal drugs. Some items may interact with your medicine. What should I watch for while using this medicine? Tell your doctor if your symptoms do not get better or if they get worse. Visit your doctor or healthcare provider for regular checks on your progress. Because it may take several weeks to see the full effects of this medicine, it is important to continue your treatment as prescribed by your doctor. This medicine may cause serious skin reactions. They can happen weeks to months after starting the medicine. Contact your healthcare provider right away if you notice fevers or flu-like symptoms with a rash. The rash may be red or purple and then turn into blisters or peeling of the skin. Or, you might notice a red rash with swelling of the face, lips or lymph nodes in your neck or under your arms. Patients and their families should watch out for new or worsening thoughts of suicide or depression. Also watch out for sudden changes in feelings such as feeling anxious, agitated, panicky, irritable, hostile, aggressive, impulsive, severely restless, overly excited and hyperactive, or not being able to sleep. If  this happens, especially at the beginning of treatment or after a change in dose, call your healthcare provider. Avoid alcoholic drinks while taking this medicine. Drinking excessive alcoholic beverages, using sleeping or anxiety medicines, or quickly stopping the use of these agents while taking this medicine may increase your risk for a seizure. Do not drive or use heavy machinery until you know how this medicine affects you. This medicine can impair your ability to perform these tasks. Do not take this medicine close to bedtime. It may prevent you from sleeping. Your mouth may get dry. Chewing sugarless gum or sucking hard candy, and drinking plenty of water may help. Contact your doctor if the problem does not go away or is severe. What side effects may I notice from receiving this medicine? Side effects that you should report to your doctor or health care professional as soon as possible:  allergic reactions like skin rash, itching or hives, swelling of the face, lips, or tongue  breathing problems  changes in vision  confusion  elevated mood, decreased need for sleep, racing thoughts, impulsive behavior  fast or irregular heartbeat  hallucinations, loss of contact with reality  increased blood pressure  rash, fever, and swollen lymph nodes  redness, blistering, peeling, or loosening of the skin, including inside the mouth  seizures  suicidal thoughts  or other mood changes  unusually weak or tired  vomiting Side effects that usually do not require medical attention (report to your doctor or health care professional if they continue or are bothersome):  constipation  headache  loss of appetite  nausea  tremors  weight loss This list may not describe all possible side effects. Call your doctor for medical advice about side effects. You may report side effects to FDA at 1-800-FDA-1088. Where should I keep my medicine? Keep out of the reach of children. Store at  room temperature between 20 and 25 degrees C (68 and 77 degrees F), away from direct sunlight and moisture. Keep tightly closed. Throw away any unused medicine after the expiration date. NOTE: This sheet is a summary. It may not cover all possible information. If you have questions about this medicine, talk to your doctor, pharmacist, or health care provider.  2020 Elsevier/Gold Standard (2018-10-11 14:02:47)

## 2019-05-29 NOTE — Assessment & Plan Note (Signed)
Chronic, ongoing.  Continue current medication regimen and adjust as needed.  Thyroid panel today. 

## 2019-05-29 NOTE — Assessment & Plan Note (Signed)
Under left breast with changes in color and size per patient.  Will refer to dermatology for further assessment.

## 2019-05-29 NOTE — Progress Notes (Signed)
BP 140/88 (BP Location: Left Arm)   Pulse 65   Temp 98.4 F (36.9 C) (Oral)   Ht 4' 9.4" (1.458 m)   Wt 131 lb 3.2 oz (59.5 kg)   LMP  (LMP Unknown)   SpO2 98%   BMI 28.00 kg/m    Subjective:    Patient ID: Savannah Whitaker, female    DOB: 02-Jun-1951, 68 y.o.   MRN: [id number]  HPI: Savannah Whitaker is a 68 y.o. female presenting on 05/29/2019 for comprehensive medical examination. Current medical complaints include:none  She currently lives with: husband Menopausal Symptoms: no   COPD No current inhaler regimen.  Recent low dose CT performed on 05/09/2019 with benign appearance.  Currently smokes 1 PPD, has been smoking since she was 24.  Is interested in quitting, discussed options and wishes to further discuss with her husband. COPD status: stable Satisfied with current treatment?: yes Oxygen use: no Dyspnea frequency: none Cough frequency: chronic, at baseline Rescue inhaler frequency:  none Limitation of activity: no Productive cough: none Last Spirometry:  Pneumovax: refused Influenza: Up to Date  HYPERTENSION / HYPERLIPIDEMIA Continues on Atorvastatin and Metoprolol.  She is not taking Amlodipine, at last visit she reports they stopped this.  On recent CT imaging lung two-vessel coronary atherosclerosis was noted, similar was noted on previous exam in 2018 and 2019.   Satisfied with current treatment? yes Duration of hypertension: chronic BP monitoring frequency: rarely BP range: 150-160/80's when she checks at home, but only checks when having symptoms BP medication side effects: no Duration of hyperlipidemia: chronic Cholesterol medication side effects: no Cholesterol supplements: none Medication compliance: good compliance Aspirin: no Recent stressors: no Recurrent headaches: no Visual changes: no Palpitations: no Dyspnea: no Chest pain: no Lower extremity edema: no Dizzy/lightheaded: no   HYPOTHYROIDISM Continues on Levothyroxine 75 MCG  daily with last TSH one year ago 0.552. Thyroid control status:stable Satisfied with current treatment? yes Medication side effects: no Medication compliance: good compliance Etiology of hypothyroidism:  Recent dose adjustment:no Fatigue: no Cold intolerance: no Heat intolerance: no Weight gain: no Weight loss: no Constipation: no Diarrhea/loose stools: no Palpitations: no Lower extremity edema: no Anxiety/depressed mood: no   VITAMIN D DEFICIENCY: Noted on problem list, no recent level. No supplements.  No recent falls or fractures.  Declines DEXA screening.  ANXIETY/STRESS Continues on Citalopram 20 MG daily. Duration:stable Anxious mood: yes  Excessive worrying: no Irritability: no  Sweating: no Nausea: no Palpitations:no Hyperventilation: no Panic attacks: no Agoraphobia: no  Obscessions/compulsions: no Depressed mood: no Depression screen [facility] 2/9 05/29/2019 03/29/2018 03/29/2018 03/29/2018 02/26/2018  Decreased Interest 1 0 0 0 1  Down, Depressed, Hopeless 1 0 0 0 0  PHQ - 2 Score 2 0 0 0 1  Altered sleeping 1 0 0 - 0  Tired, decreased energy 0 0 0 - 1  Change in appetite 0 0 0 - 1  Feeling bad or failure about yourself  0 0 0 - 0  Trouble concentrating 0 0 0 - 0  Moving slowly or fidgety/restless 0 0 0 - 0  Suicidal thoughts 0 0 0 - 0  PHQ-9 Score 3 0 0 - 3  Difficult doing work/chores Not difficult at all - - - -   Anhedonia: no Weight changes: no Insomnia: no none  Hypersomnia: no Fatigue/loss of energy: no Feelings of worthlessness: no Feelings of guilt: no Impaired concentration/indecisiveness: no Suicidal ideations: no  Crying spells: no Recent Stressors/Life Changes: no   Relationship problems:  no   Family stress: no     Financial stress: no    Job stress: no    Recent death/loss: no  SKIN LESION Noted during exam under left breast, she reports it has been there a "long while" and has changed in color and size, darker in color. Duration:  years Location: under left breast Painful: no Itching: no Onset: gradual Context: changing Associated signs and symptoms:  History of skin cancer: no History of precancerous skin lesions: no Family history of skin cancer: no  The patient does not have a history of falls. I did not complete a risk assessment for falls. A plan of care for falls was not documented.   Past Medical History:  Past Medical History:  Diagnosis Date  . Anxiety   . Hyperlipidemia   . Hypertension   . Hypertriglyceridemia   . IFG (impaired fasting glucose)   . Osteoarthritis   . Thyroid disease   . Tobacco use disorder   . Vitamin D deficiency     Surgical History:  Past Surgical History:  Procedure Laterality Date  . CARPAL TUNNEL RELEASE Right   . TUBAL LIGATION      Medications:  Current Outpatient Medications on File Prior to Visit  Medication Sig  . citalopram (CELEXA) 20 MG tablet TAKE 1 TABLET BY MOUTH EVERY DAY   No current facility-administered medications on file prior to visit.     Allergies:  No Known Allergies  Social History:  Social History   Socioeconomic History  . Marital status: Married    Spouse name: Not on file  . Number of children: Not on file  . Years of education: Not on file  . Highest education level: High school graduate  Occupational History  . Not on file  Social Needs  . Financial resource strain: Not hard at all  . Food insecurity    Worry: Never true    Inability: Never true  . Transportation needs    Medical: No    Non-medical: No  Tobacco Use  . Smoking status: Current Every Day Smoker    Packs/day: 1.00    Years: 40.00    Pack years: 40.00    Types: Cigarettes  . Smokeless tobacco: Never Used  Substance and Sexual Activity  . Alcohol use: Not on file    Comment: glass of wine few times a week   . Drug use: No  . Sexual activity: Yes  Lifestyle  . Physical activity    Days per week: 0 days    Minutes per session: 0 min  .  Stress: Not at all  Relationships  . Social connections    Talks on phone: More than three times a week    Gets together: Three times a week    Attends religious service: More than 4 times per year    Active member of club or organization: No    Attends meetings of clubs or organizations: Never    Relationship status: Married  . Intimate partner violence    Fear of current or ex partner: No    Emotionally abused: No    Physically abused: No    Forced sexual activity: No  Other Topics Concern  . 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    Social History   Tobacco Use  Smoking Status Current Every Day Smoker  . Packs/day: 1.00  . Years: 40.00  .  Pack years: 40.00  . Types: Cigarettes  Smokeless Tobacco Never Used   Social History   Substance and Sexual Activity  Alcohol Use None   Comment: glass of wine few times a week     Family History:  Family History  Problem Relation Age of Onset  . Lung cancer Mother   . Heart disease Father   . Colon cancer Brother   . Alcohol abuse Brother   . Cancer Brother        mouth  . Breast cancer Neg Hx     Past medical history, surgical history, medications, allergies, family history and social history reviewed with patient today and changes made to appropriate areas of the chart.   Review of Systems - negative All other ROS negative except what is listed above and in the HPI.      Objective:    BP 140/88 (BP Location: Left Arm)   Pulse 65   Temp 98.4 F (36.9 C) (Oral)   Ht 4' 9.4" (1.458 m)   Wt 131 lb 3.2 oz (59.5 kg)   LMP  (LMP Unknown)   SpO2 98%   BMI 28.00 kg/m   Wt Readings from Last 3 Encounters:  05/29/19 131 lb 3.2 oz (59.5 kg)  05/09/19 126 lb (57.2 kg)  03/29/18 126 lb 1.6 oz (57.2 kg)    Physical Exam Constitutional:      General: She is awake. She is not in acute distress.    Appearance: She is well-developed. She is not  ill-appearing.  HENT:     Head: Normocephalic and atraumatic.     Right Ear: Hearing, tympanic membrane, ear canal and external ear normal. No drainage.     Left Ear: Hearing, tympanic membrane, ear canal and external ear normal. No drainage.     Nose: Nose normal.     Right Sinus: No maxillary sinus tenderness or frontal sinus tenderness.     Left Sinus: No maxillary sinus tenderness or frontal sinus tenderness.     Mouth/Throat:     Mouth: Mucous membranes are moist.     Pharynx: Oropharynx is clear. Uvula midline. No pharyngeal swelling, oropharyngeal exudate or posterior oropharyngeal erythema.  Eyes:     General: Lids are normal.        Right eye: No discharge.        Left eye: No discharge.     Extraocular Movements: Extraocular movements intact.     Conjunctiva/sclera: Conjunctivae normal.     Pupils: Pupils are equal, round, and reactive to light.     Visual Fields: Right eye visual fields normal and left eye visual fields normal.  Neck:     Musculoskeletal: Normal range of motion and neck supple.     Thyroid: No thyromegaly.     Vascular: No carotid bruit.     Trachea: Trachea normal.  Cardiovascular:     Rate and Rhythm: Normal rate and regular rhythm.     Heart sounds: Normal heart sounds. No murmur. No gallop.   Pulmonary:     Effort: Pulmonary effort is normal. No accessory muscle usage or respiratory distress.     Breath sounds: Normal breath sounds.  Chest:     Breasts:        Right: Normal. No swelling, bleeding, inverted nipple, mass, nipple discharge, skin change or tenderness.        Left: Normal. No swelling, bleeding, inverted nipple, mass, nipple discharge, skin change or tenderness.  Abdominal:     General:  Bowel sounds are normal.     Palpations: Abdomen is soft. There is no hepatomegaly or splenomegaly.     Tenderness: There is no abdominal tenderness.  Musculoskeletal: Normal range of motion.     Right lower leg: No edema.     Left lower leg: No  edema.  Lymphadenopathy:     Head:     Right side of head: No submental, submandibular, tonsillar, preauricular or posterior auricular adenopathy.     Left side of head: No submental, submandibular, tonsillar, preauricular or posterior auricular adenopathy.     Cervical: No cervical adenopathy.     Upper Body:     Right upper body: No supraclavicular, axillary or pectoral adenopathy.     Left upper body: No supraclavicular, axillary or pectoral adenopathy.  Skin:    General: Skin is warm and dry.     Capillary Refill: Capillary refill takes less than 2 seconds.     Findings: No rash.          Comments: Erythema under bilateral breast with mild excoriation.  Neurological:     Mental Status: She is alert and oriented to person, place, and time.     Cranial Nerves: Cranial nerves are intact.     Gait: Gait is intact.     Deep Tendon Reflexes: Reflexes are normal and symmetric.     Reflex Scores:      Brachioradialis reflexes are 2+ on the right side and 2+ on the left side.      Patellar reflexes are 2+ on the right side and 2+ on the left side. Psychiatric:        Attention and Perception: Attention normal.        Mood and Affect: Mood normal.        Speech: Speech normal.        Behavior: Behavior normal. Behavior is cooperative.        Thought Content: Thought content normal.        Judgment: Judgment normal.    Results for orders placed or performed in visit on 03/29/18  Lipid Profile  Result Value Ref Range   Cholesterol, Total 267 (H) 100 - 199 mg/dL   Triglycerides 161 (HH) 0 - 149 mg/dL   HDL 31 (L) >09 mg/dL   VLDL Cholesterol Cal Comment 5 - 40 mg/dL   LDL Calculated Comment 0 - 99 mg/dL   Chol/HDL Ratio 8.6 (H) 0.0 - 4.4 ratio  LDL cholesterol, direct  Result Value Ref Range   LDL Direct 69 0 - 99 mg/dL  Specimen status report  Result Value Ref Range   specimen status report Comment       Assessment & Plan:   Problem List Items Addressed This Visit       Cardiovascular and Mediastinum   Hypertension    Chronic, ongoing with BP above goal today.  Will add on low doe Amlodipine, 2.5 MG, script sent. Continue Metoprolol.  Recommend checking BP daily at home and documenting for provider.  Return in 4 weeks to office.      Relevant Medications   amLODipine (NORVASC) 2.5 MG tablet   atorvastatin (LIPITOR) 20 MG tablet   metoprolol succinate (TOPROL-XL) 50 MG 24 hr tablet   Other Relevant Orders   CBC with Differential/Platelet   Atherosclerosis of native coronary artery of native heart without angina pectoris    Continue daily statin and recommend complete cessation of smoking.  Recommend daily Baby ASA 81 MG.  If symptoms present  will refer to cardiology.      Relevant Medications   amLODipine (NORVASC) 2.5 MG tablet   atorvastatin (LIPITOR) 20 MG tablet   metoprolol succinate (TOPROL-XL) 50 MG 24 hr tablet     Respiratory   Emphysema of lung (HCC)    Chronic, ongoing with no inhalers.  Recommend complete cessation of smoking and continue yearly CT screening.  Return in 4 weeks for spirometry.        Endocrine   Hypothyroidism    Chronic, ongoing.  Continue current medication regimen and adjust as needed.  Thyroid panel today.      Relevant Medications   levothyroxine (SYNTHROID) 75 MCG tablet   metoprolol succinate (TOPROL-XL) 50 MG 24 hr tablet   Other Relevant Orders   Thyroid Panel With TSH     Nervous and Auditory   Nicotine dependence, cigarettes, w unsp disorders    I have recommended complete cessation of tobacco use. I have discussed various options available for assistance with tobacco cessation including over the counter methods (Nicotine gum, patch and lozenges). We also discussed prescription options (Chantix, Nicotine Inhaler / Nasal Spray). The patient is interested in Wellbutrin and wishes to discuss with her spouse.  Would avoid Chantix due to anxiety history.         Musculoskeletal and Integument   Skin  lesion    Under left breast with changes in color and size per patient.  Will refer to dermatology for further assessment.      Relevant Orders   Ambulatory referral to Dermatology     Other   Hyperlipidemia    Chronic, ongoing.  Continue current medication regimen and adjust as needed.  Lipid panel and CMP today.      Relevant Medications   amLODipine (NORVASC) 2.5 MG tablet   atorvastatin (LIPITOR) 20 MG tablet   metoprolol succinate (TOPROL-XL) 50 MG 24 hr tablet   Other Relevant Orders   Comprehensive metabolic panel   Lipid Panel w/o Chol/HDL Ratio   Vitamin D deficiency    Check Vit D level today and add supplement as needed.  Refuses DEXA scan.      Relevant Orders   VITAMIN D 25 Hydroxy (Vit-D Deficiency, Fractures)   Chronic anxiety    Chronic, stable. Denies SI/HI.  Continue current medication regimen and adjust as needed.  Refills sent.         Other Visit Diagnoses    Annual physical exam    -  Primary   Need for influenza vaccination       Relevant Orders   Flu Vaccine QUAD High Dose(Fluad) (Completed)       Follow up plan: Return in about 4 weeks (around 06/26/2019) for HTN and COPD (need spirometry).   LABORATORY TESTING:  - Pap smear: not applicable  IMMUNIZATIONS:   - Tdap: Tetanus vaccination status reviewed: refuses - Influenza: Up to date - Pneumovax: Refused - Prevnar: Refused - HPV: Not applicable - Zostavax vaccine: Refused  SCREENING: -Mammogram: Refused  - Colonoscopy: Refused  - Bone Density: Refused  -Hearing Test: Not applicable  -Spirometry: Not applicable   PATIENT COUNSELING:   Advised to take 1 mg of folate supplement per day if capable of pregnancy.   Sexuality: Discussed sexually transmitted diseases, partner selection, use of condoms, avoidance of unintended pregnancy  and contraceptive alternatives.   Advised to avoid cigarette smoking.  I discussed with the patient that most people either abstain from alcohol or  drink within safe limits (<=14/week and <=  4 drinks/occasion for males, <=7/weeks and <= 3 drinks/occasion for females) and that the risk for alcohol disorders and other health effects rises proportionally with the number of drinks per week and how often a drinker exceeds daily limits.  Discussed cessation/primary prevention of drug use and availability of treatment for abuse.   Diet: Encouraged to adjust caloric intake to maintain  or achieve ideal body weight, to reduce intake of dietary saturated fat and total fat, to limit sodium intake by avoiding high sodium foods and not adding table salt, and to maintain adequate dietary potassium and calcium preferably from fresh fruits, vegetables, and low-fat dairy products.    stressed the importance of regular exercise  Injury prevention: Discussed safety belts, safety helmets, smoke detector, smoking near bedding or upholstery.   Dental health: Discussed importance of regular tooth brushing, flossing, and dental visits.    NEXT PREVENTATIVE PHYSICAL DUE IN 1 YEAR. Return in about 4 weeks (around 06/26/2019) for HTN and COPD (need spirometry).

## 2019-05-29 NOTE — Assessment & Plan Note (Signed)
Chronic, ongoing with no inhalers.  Recommend complete cessation of smoking and continue yearly CT screening.  Return in 4 weeks for spirometry.

## 2019-05-29 NOTE — Assessment & Plan Note (Signed)
Continue daily statin and recommend complete cessation of smoking.  Recommend daily Baby ASA 81 MG.  If symptoms present will refer to cardiology. 

## 2019-05-29 NOTE — Assessment & Plan Note (Signed)
Chronic, ongoing.  Continue current medication regimen and adjust as needed.  Lipid panel and CMP today.    

## 2019-05-29 NOTE — Assessment & Plan Note (Signed)
Check Vit D level today and add supplement as needed.  Refuses DEXA scan.

## 2019-05-29 NOTE — Assessment & Plan Note (Signed)
I have recommended complete cessation of tobacco use. I have discussed various options available for assistance with tobacco cessation including over the counter methods (Nicotine gum, patch and lozenges). We also discussed prescription options (Chantix, Nicotine Inhaler / Nasal Spray). The patient is interested in Wellbutrin and wishes to discuss with her spouse.  Would avoid Chantix due to anxiety history.

## 2019-05-30 ENCOUNTER — Other Ambulatory Visit: Payer: Self-pay | Admitting: Nurse Practitioner

## 2019-05-30 LAB — LIPID PANEL W/O CHOL/HDL RATIO
Cholesterol, Total: 206 mg/dL — ABNORMAL HIGH (ref 100–199)
HDL: 35 mg/dL — ABNORMAL LOW (ref >39)
LDL Chol Calc (NIH): 106 mg/dL — ABNORMAL HIGH (ref 0–99)
Triglycerides: 377 mg/dL — ABNORMAL HIGH (ref 0–149)
VLDL Cholesterol Cal: 65 mg/dL — ABNORMAL HIGH (ref 5–40)

## 2019-05-30 LAB — COMPREHENSIVE METABOLIC PANEL
ALT: 15 IU/L (ref 0–32)
AST: 17 IU/L (ref 0–40)
Albumin/Globulin Ratio: 1.5 (ref 1.2–2.2)
Albumin: 4.4 g/dL (ref 3.8–4.8)
Alkaline Phosphatase: 80 IU/L (ref 39–117)
BUN/Creatinine Ratio: 20 (ref 12–28)
BUN: 15 mg/dL (ref 8–27)
Bilirubin Total: 0.5 mg/dL (ref 0.0–1.2)
CO2: 20 mmol/L (ref 20–29)
Calcium: 9.3 mg/dL (ref 8.7–10.3)
Chloride: 103 mmol/L (ref 96–106)
Creatinine, Ser: 0.75 mg/dL (ref 0.57–1.00)
GFR calc Af Amer: 95 mL/min/{1.73_m2} (ref >59)
GFR calc non Af Amer: 83 mL/min/{1.73_m2} (ref >59)
Globulin, Total: 2.9 g/dL (ref 1.5–4.5)
Glucose: 103 mg/dL — ABNORMAL HIGH (ref 65–99)
Potassium: 4.3 mmol/L (ref 3.5–5.2)
Sodium: 139 mmol/L (ref 134–144)
Total Protein: 7.3 g/dL (ref 6.0–8.5)

## 2019-05-30 LAB — CBC WITH DIFFERENTIAL/PLATELET
Basophils Absolute: 0.1 10*3/uL (ref 0.0–0.2)
Basos: 1 %
EOS (ABSOLUTE): 0.2 10*3/uL (ref 0.0–0.4)
Eos: 3 %
Hematocrit: 42.8 % (ref 34.0–46.6)
Hemoglobin: 14.2 g/dL (ref 11.1–15.9)
Immature Grans (Abs): 0 10*3/uL (ref 0.0–0.1)
Immature Granulocytes: 0 %
Lymphocytes Absolute: 2.1 10*3/uL (ref 0.7–3.1)
Lymphs: 25 %
MCH: 29.4 pg (ref 26.6–33.0)
MCHC: 33.2 g/dL (ref 31.5–35.7)
MCV: 89 fL (ref 79–97)
Monocytes Absolute: 0.4 10*3/uL (ref 0.1–0.9)
Monocytes: 4 %
Neutrophils Absolute: 5.5 10*3/uL (ref 1.4–7.0)
Neutrophils: 67 %
Platelets: 255 10*3/uL (ref 150–450)
RBC: 4.83 x10E6/uL (ref 3.77–5.28)
RDW: 13.3 % (ref 11.7–15.4)
WBC: 8.3 10*3/uL (ref 3.4–10.8)

## 2019-05-30 LAB — THYROID PANEL WITH TSH
Free Thyroxine Index: 1.8 (ref 1.2–4.9)
T3 Uptake Ratio: 25 % (ref 24–39)
T4, Total: 7.1 ug/dL (ref 4.5–12.0)
TSH: 1.87 u[IU]/mL (ref 0.450–4.500)

## 2019-05-30 LAB — VITAMIN D 25 HYDROXY (VIT D DEFICIENCY, FRACTURES): Vit D, 25-Hydroxy: 14.3 ng/mL — ABNORMAL LOW (ref 30.0–100.0)

## 2019-05-30 MED ORDER — CHOLECALCIFEROL 1.25 MG (50000 UT) PO TABS: 1.0000 | ORAL_TABLET | ORAL | 0 refills | Status: DC

## 2019-05-30 MED ORDER — ATORVASTATIN CALCIUM 40 MG PO TABS: 40.0000 mg | ORAL_TABLET | Freq: Every day | ORAL | 2 refills | Status: DC

## 2019-07-01 ENCOUNTER — Ambulatory Visit: Payer: Medicare HMO | Admitting: Nurse Practitioner

## 2019-07-01 ENCOUNTER — Other Ambulatory Visit: Payer: Self-pay

## 2019-07-01 ENCOUNTER — Encounter: Payer: Self-pay | Admitting: Nurse Practitioner

## 2019-07-01 ENCOUNTER — Ambulatory Visit (INDEPENDENT_AMBULATORY_CARE_PROVIDER_SITE_OTHER): Payer: Medicare HMO | Admitting: Nurse Practitioner

## 2019-07-01 VITALS — BP 125/77 | HR 72 | Temp 98.7°F

## 2019-07-01 DIAGNOSIS — E559 Vitamin D deficiency, unspecified: Secondary | ICD-10-CM

## 2019-07-01 DIAGNOSIS — E785 Hyperlipidemia, unspecified: Secondary | ICD-10-CM

## 2019-07-01 DIAGNOSIS — I10 Essential (primary) hypertension: Secondary | ICD-10-CM | POA: Diagnosis not present

## 2019-07-01 DIAGNOSIS — J439 Emphysema, unspecified: Secondary | ICD-10-CM

## 2019-07-01 DIAGNOSIS — R69 Illness, unspecified: Secondary | ICD-10-CM | POA: Diagnosis not present

## 2019-07-01 DIAGNOSIS — F17219 Nicotine dependence, cigarettes, with unspecified nicotine-induced disorders: Secondary | ICD-10-CM

## 2019-07-01 NOTE — Assessment & Plan Note (Signed)
Chronic, ongoing with no inhalers.  Today FEV1/FVC 110% and FEV1 86%. Recommend complete cessation of smoking and continue yearly CT screening.  Plan on monitoring lung function annually and add inhalers as needed.

## 2019-07-01 NOTE — Assessment & Plan Note (Signed)
Chronic, ongoing with BP above goal today. Continue Metoprolol and Amlodipine.  Recommend checking BP daily at home and documenting for provider.  Return in 6 months

## 2019-07-01 NOTE — Patient Instructions (Signed)

## 2019-07-01 NOTE — Assessment & Plan Note (Signed)
Chronic, ongoing with recent increase to 40 MG Lipitor.  Continue this dose and recheck lipid and CMP today (nonfasting).  Adjust as needed.

## 2019-07-01 NOTE — Assessment & Plan Note (Signed)
Continue supplement and recheck Vit D today.

## 2019-07-01 NOTE — Progress Notes (Signed)
BP 125/77   Pulse 72   Temp 98.7 F (37.1 C) (Oral)   LMP  (LMP Unknown)   SpO2 97%    Subjective:    Patient ID: Savannah Whitaker, female    DOB: 30-Nov-1950, 68 y.o.   MRN: [id number]  HPI: Savannah Whitaker is a 68 y.o. female  Chief Complaint  Patient presents with  . COPD  . Hypertension   COPD No current inhaler regimen.  Recent low dose CT performed on 05/09/2019 with benign appearance.  Currently smokes 1 PPD, has been smoking since she was 24.  Is interested in quitting, discussed options and wishes to further discuss with her husband.  Does endorse allergies bother her at night and is going to start Claritin. COPD status: stable Satisfied with current treatment?: yes Oxygen use: no Dyspnea frequency:  none Cough frequency: none Rescue inhaler frequency:  none Limitation of activity: no Productive cough:  Last Spirometry:  Pneumovax: Up to Date Influenza: Up to Date   HYPERTENSION & HLD Continues on Atorvastatin and Metoprolol.  She is not taking Amlodipine, at last visit she reports they stopped this, and restarted at recent visit.  On recent CT imaging lung two-vessel coronary atherosclerosis was noted, similar was noted on previous exam in 2018 and 2019.  Her Lipitor was increased to 40 MG at last visit, which she has tolerated well. Hypertension status: stable  Satisfied with current treatment? yes Duration of hypertension: chronic BP monitoring frequency:  not checking BP range:  BP medication side effects:  no Medication compliance: good compliance Aspirin: no Recurrent headaches: no Visual changes: no Palpitations: no Dyspnea: no Chest pain: no Lower extremity edema: no Dizzy/lightheaded: no   VITAMIN D DEFICIENCY: Taking daily weekly supplement as ordered last visit.  No recent falls or fractures.  Relevant past medical, surgical, family and social history reviewed and updated as indicated. Interim medical history since our last visit  reviewed. Allergies and medications reviewed and updated.  Review of Systems  Constitutional: Negative for activity change, appetite change, diaphoresis, fatigue and fever.  Respiratory: Negative for cough, chest tightness and shortness of breath.   Cardiovascular: Negative for chest pain, palpitations and leg swelling.  Gastrointestinal: Negative for abdominal distention, abdominal pain, constipation, diarrhea, nausea and vomiting.  Neurological: Negative for dizziness, syncope, weakness, light-headedness, numbness and headaches.  Psychiatric/Behavioral: Negative.     Per HPI unless specifically indicated above     Objective:    BP 125/77   Pulse 72   Temp 98.7 F (37.1 C) (Oral)   LMP  (LMP Unknown)   SpO2 97%   Wt Readings from Last 3 Encounters:  05/29/19 131 lb 3.2 oz (59.5 kg)  05/09/19 126 lb (57.2 kg)  03/29/18 126 lb 1.6 oz (57.2 kg)    Physical Exam Vitals signs and nursing note reviewed.  Constitutional:      General: She is awake. She is not in acute distress.    Appearance: She is well-developed. She is not ill-appearing.  HENT:     Head: Normocephalic.     Right Ear: Hearing normal.     Left Ear: Hearing normal.  Eyes:     General: Lids are normal.        Right eye: No discharge.        Left eye: No discharge.     Conjunctiva/sclera: Conjunctivae normal.     Pupils: Pupils are equal, round, and reactive to light.  Neck:     Musculoskeletal: Normal range  of motion and neck supple.     Vascular: No carotid bruit.  Cardiovascular:     Rate and Rhythm: Normal rate and regular rhythm.     Heart sounds: Normal heart sounds. No murmur. No gallop.   Pulmonary:     Effort: Pulmonary effort is normal. No accessory muscle usage or respiratory distress.     Breath sounds: Normal breath sounds.  Abdominal:     General: Bowel sounds are normal.     Palpations: Abdomen is soft.  Musculoskeletal:     Right lower leg: No edema.     Left lower leg: No edema.   Skin:    General: Skin is warm and dry.  Neurological:     Mental Status: She is alert and oriented to person, place, and time.  Psychiatric:        Attention and Perception: Attention normal.        Mood and Affect: Mood normal.        Behavior: Behavior normal. Behavior is cooperative.        Thought Content: Thought content normal.        Judgment: Judgment normal.     Results for orders placed or performed in visit on 05/29/19  CBC with Differential/Platelet  Result Value Ref Range   WBC 8.3 3.4 - 10.8 x10E3/uL   RBC 4.83 3.77 - 5.28 x10E6/uL   Hemoglobin 14.2 11.1 - 15.9 g/dL   Hematocrit 35.542.8 73.234.0 - 46.6 %   MCV 89 79 - 97 fL   MCH 29.4 26.6 - 33.0 pg   MCHC 33.2 31.5 - 35.7 g/dL   RDW 20.213.3 54.211.7 - 70.615.4 %   Platelets 255 150 - 450 x10E3/uL   Neutrophils 67 Not Estab. %   Lymphs 25 Not Estab. %   Monocytes 4 Not Estab. %   Eos 3 Not Estab. %   Basos 1 Not Estab. %   Neutrophils Absolute 5.5 1.4 - 7.0 x10E3/uL   Lymphocytes Absolute 2.1 0.7 - 3.1 x10E3/uL   Monocytes Absolute 0.4 0.1 - 0.9 x10E3/uL   EOS (ABSOLUTE) 0.2 0.0 - 0.4 x10E3/uL   Basophils Absolute 0.1 0.0 - 0.2 x10E3/uL   Immature Granulocytes 0 Not Estab. %   Immature Grans (Abs) 0.0 0.0 - 0.1 x10E3/uL  Comprehensive metabolic panel  Result Value Ref Range   Glucose 103 (H) 65 - 99 mg/dL   BUN 15 8 - 27 mg/dL   Creatinine, Ser 2.370.75 0.57 - 1.00 mg/dL   GFR calc non Af Amer 83 >59 mL/min/1.73   GFR calc Af Amer 95 >59 mL/min/1.73   BUN/Creatinine Ratio 20 12 - 28   Sodium 139 134 - 144 mmol/L   Potassium 4.3 3.5 - 5.2 mmol/L   Chloride 103 96 - 106 mmol/L   CO2 20 20 - 29 mmol/L   Calcium 9.3 8.7 - 10.3 mg/dL   Total Protein 7.3 6.0 - 8.5 g/dL   Albumin 4.4 3.8 - 4.8 g/dL   Globulin, Total 2.9 1.5 - 4.5 g/dL   Albumin/Globulin Ratio 1.5 1.2 - 2.2   Bilirubin Total 0.5 0.0 - 1.2 mg/dL   Alkaline Phosphatase 80 39 - 117 IU/L   AST 17 0 - 40 IU/L   ALT 15 0 - 32 IU/L  Lipid Panel w/o Chol/HDL Ratio   Result Value Ref Range   Cholesterol, Total 206 (H) 100 - 199 mg/dL   Triglycerides 628377 (H) 0 - 149 mg/dL   HDL 35 (L) >31>39 mg/dL  VLDL Cholesterol Cal 65 (H) 5 - 40 mg/dL   LDL Chol Calc (NIH) 106 (H) 0 - 99 mg/dL  VITAMIN D 25 Hydroxy (Vit-D Deficiency, Fractures)  Result Value Ref Range   Vit D, 25-Hydroxy 14.3 (L) 30.0 - 100.0 ng/mL  Thyroid Panel With TSH  Result Value Ref Range   TSH 1.870 0.450 - 4.500 uIU/mL   T4, Total 7.1 4.5 - 12.0 ug/dL   T3 Uptake Ratio 25 24 - 39 %   Free Thyroxine Index 1.8 1.2 - 4.9      Assessment & Plan:   Problem List Items Addressed This Visit      Cardiovascular and Mediastinum   Hypertension    Chronic, ongoing with BP above goal today. Continue Metoprolol and Amlodipine.  Recommend checking BP daily at home and documenting for provider.  Return in 6 months        Respiratory   Emphysema of lung ([place]) - Primary    Chronic, ongoing with no inhalers.  Today FEV1/FVC 110% and FEV1 86%. Recommend complete cessation of smoking and continue yearly CT screening.  Plan on monitoring lung function annually and add inhalers as needed.        Nervous and Auditory   Nicotine dependence, cigarettes, w unsp disorders    I have recommended complete cessation of tobacco use. I have discussed various options available for assistance with tobacco cessation including over the counter methods (Nicotine gum, patch and lozenges). We also discussed prescription options (Chantix, Nicotine Inhaler / Nasal Spray). The patient is not interested in pursuing any prescription tobacco cessation options at this time.       Relevant Orders   Spirometry with Graph (Completed)     Other   Hyperlipidemia    Chronic, ongoing with recent increase to 40 MG Lipitor.  Continue this dose and recheck lipid and CMP today (nonfasting).  Adjust as needed.      Relevant Orders   Lipid Panel w/o Chol/HDL Ratio out   Comprehensive metabolic panel   Vitamin D deficiency     Continue supplement and recheck Vit D today.      Relevant Orders   Vit D  25 hydroxy (rtn osteoporosis monitoring)       Follow up plan: Return in about 6 months (around 12/29/2019) for COPD, HTN/HLD, Vit D, Hypothyroid, Mood.

## 2019-07-01 NOTE — Assessment & Plan Note (Signed)
I have recommended complete cessation of tobacco use. I have discussed various options available for assistance with tobacco cessation including over the counter methods (Nicotine gum, patch and lozenges). We also discussed prescription options (Chantix, Nicotine Inhaler / Nasal Spray). The patient is not interested in pursuing any prescription tobacco cessation options at this time.  

## 2019-07-02 LAB — COMPREHENSIVE METABOLIC PANEL
ALT: 10 IU/L (ref 0–32)
AST: 11 IU/L (ref 0–40)
Albumin/Globulin Ratio: 1.5 (ref 1.2–2.2)
Albumin: 4.1 g/dL (ref 3.8–4.8)
Alkaline Phosphatase: 73 IU/L (ref 39–117)
BUN/Creatinine Ratio: 20 (ref 12–28)
BUN: 16 mg/dL (ref 8–27)
Bilirubin Total: 0.3 mg/dL (ref 0.0–1.2)
CO2: 21 mmol/L (ref 20–29)
Calcium: 9.3 mg/dL (ref 8.7–10.3)
Chloride: 101 mmol/L (ref 96–106)
Creatinine, Ser: 0.81 mg/dL (ref 0.57–1.00)
GFR calc Af Amer: 87 mL/min/{1.73_m2} (ref >59)
GFR calc non Af Amer: 75 mL/min/{1.73_m2} (ref >59)
Globulin, Total: 2.7 g/dL (ref 1.5–4.5)
Glucose: 88 mg/dL (ref 65–99)
Potassium: 4 mmol/L (ref 3.5–5.2)
Sodium: 140 mmol/L (ref 134–144)
Total Protein: 6.8 g/dL (ref 6.0–8.5)

## 2019-07-02 LAB — LIPID PANEL W/O CHOL/HDL RATIO
Cholesterol, Total: 196 mg/dL (ref 100–199)
HDL: 29 mg/dL — ABNORMAL LOW (ref >39)
LDL Chol Calc (NIH): 62 mg/dL (ref 0–99)
Triglycerides: 697 mg/dL (ref 0–149)
VLDL Cholesterol Cal: 105 mg/dL — ABNORMAL HIGH (ref 5–40)

## 2019-07-02 LAB — VITAMIN D 25 HYDROXY (VIT D DEFICIENCY, FRACTURES): Vit D, 25-Hydroxy: 28.2 ng/mL — ABNORMAL LOW (ref 30.0–100.0)

## 2019-07-23 ENCOUNTER — Other Ambulatory Visit: Payer: Self-pay | Admitting: Nurse Practitioner

## 2019-08-26 ENCOUNTER — Other Ambulatory Visit: Payer: Self-pay | Admitting: Nurse Practitioner

## 2019-08-26 DIAGNOSIS — I10 Essential (primary) hypertension: Secondary | ICD-10-CM

## 2019-09-26 ENCOUNTER — Ambulatory Visit (INDEPENDENT_AMBULATORY_CARE_PROVIDER_SITE_OTHER): Payer: Medicare HMO

## 2019-09-26 DIAGNOSIS — Z0000 Encounter for general adult medical examination without abnormal findings: Secondary | ICD-10-CM

## 2019-09-26 NOTE — Patient Instructions (Signed)
Savannah Whitaker , Thank you for taking time to come for your Medicare Wellness Visit. I appreciate your ongoing commitment to your health goals. Please review the following plan we discussed and let me know if I can assist you in the future.   Screening recommendations/referrals: Colonoscopy: declined Mammogram: declined Bone Density: declined Recommended yearly ophthalmology/optometry visit for glaucoma screening and checkup Recommended yearly dental visit for hygiene and checkup  Vaccinations: Influenza vaccine: up to date Pneumococcal vaccine: declined  Tdap vaccine: due now  Shingles vaccine: declined     Advanced directives: Advance directive discussed with you today. Once this is complete please bring a copy in to our office so we can scan it into your chart.  Conditions/risks identified: none   Next appointment: Follow up in one year for your annual wellness visit    Preventive Care 65 Years and Older, Female Preventive care refers to lifestyle choices and visits with your health care provider that can promote health and wellness. What does preventive care include?  A yearly physical exam. This is also called an annual well check.  Dental exams once or twice a year.  Routine eye exams. Ask your health care provider how often you should have your eyes checked.  Personal lifestyle choices, including:  Daily care of your teeth and gums.  Regular physical activity.  Eating a healthy diet.  Avoiding tobacco and drug use.  Limiting alcohol use.  Practicing safe sex.  Taking low-dose aspirin every day.  Taking vitamin and mineral supplements as recommended by your health care provider. What happens during an annual well check? The services and screenings done by your health care provider during your annual well check will depend on your age, overall health, lifestyle risk factors, and family history of disease. Counseling  Your health care provider may ask you questions  about your:  Alcohol use.  Tobacco use.  Drug use.  Emotional well-being.  Home and relationship well-being.  Sexual activity.  Eating habits.  History of falls.  Memory and ability to understand (cognition).  Work and work Astronomer.  Reproductive health. Screening  You may have the following tests or measurements:  Height, weight, and BMI.  Blood pressure.  Lipid and cholesterol levels. These may be checked every 5 years, or more frequently if you are over 14 years old.  Skin check.  Lung cancer screening. You may have this screening every year starting at age 101 if you have a 30-pack-year history of smoking and currently smoke or have quit within the past 15 years.  Fecal occult blood test (FOBT) of the stool. You may have this test every year starting at age 9.  Flexible sigmoidoscopy or colonoscopy. You may have a sigmoidoscopy every 5 years or a colonoscopy every 10 years starting at age 75.  Hepatitis C blood test.  Hepatitis B blood test.  Sexually transmitted disease (STD) testing.  Diabetes screening. This is done by checking your blood sugar (glucose) after you have not eaten for a while (fasting). You may have this done every 1-3 years.  Bone density scan. This is done to screen for osteoporosis. You may have this done starting at age 81.  Mammogram. This may be done every 1-2 years. Talk to your health care provider about how often you should have regular mammograms. Talk with your health care provider about your test results, treatment options, and if necessary, the need for more tests. Vaccines  Your health care provider may recommend certain vaccines, such as:  Influenza vaccine. This is recommended every year.  Tetanus, diphtheria, and acellular pertussis (Tdap, Td) vaccine. You may need a Td booster every 10 years.  Zoster vaccine. You may need this after age 19.  Pneumococcal 13-valent conjugate (PCV13) vaccine. One dose is  recommended after age 32.  Pneumococcal polysaccharide (PPSV23) vaccine. One dose is recommended after age 51. Talk to your health care provider about which screenings and vaccines you need and how often you need them. This information is not intended to replace advice given to you by your health care provider. Make sure you discuss any questions you have with your health care provider. Document Released: 08/14/2015 Document Revised: 04/06/2016 Document Reviewed: 05/19/2015 Elsevier Interactive Patient Education  2017 [place] Prevention in the Home Falls can cause injuries. They can happen to people of all ages. There are many things you can do to make your home safe and to help prevent falls. What can I do on the outside of my home?  Regularly fix the edges of walkways and driveways and fix any cracks.  Remove anything that might make you trip as you walk through a door, such as a raised step or threshold.  Trim any bushes or trees on the path to your home.  Use bright outdoor lighting.  Clear any walking paths of anything that might make someone trip, such as rocks or tools.  Regularly check to see if handrails are loose or broken. Make sure that both sides of any steps have handrails.  Any raised decks and porches should have guardrails on the edges.  Have any leaves, snow, or ice cleared regularly.  Use sand or salt on walking paths during winter.  Clean up any spills in your garage right away. This includes oil or grease spills. What can I do in the bathroom?  Use night lights.  Install grab bars by the toilet and in the tub and shower. Do not use towel bars as grab bars.  Use non-skid mats or decals in the tub or shower.  If you need to sit down in the shower, use a plastic, non-slip stool.  Keep the floor dry. Clean up any water that spills on the floor as soon as it happens.  Remove soap buildup in the tub or shower regularly.  Attach bath mats  securely with double-sided non-slip rug tape.  Do not have throw rugs and other things on the floor that can make you trip. What can I do in the bedroom?  Use night lights.  Make sure that you have a light by your bed that is easy to reach.  Do not use any sheets or blankets that are too big for your bed. They should not hang down onto the floor.  Have a firm chair that has side arms. You can use this for support while you get dressed.  Do not have throw rugs and other things on the floor that can make you trip. What can I do in the kitchen?  Clean up any spills right away.  Avoid walking on wet floors.  Keep items that you use a lot in easy-to-reach places.  If you need to reach something above you, use a strong step stool that has a grab bar.  Keep electrical cords out of the way.  Do not use floor polish or wax that makes floors slippery. If you must use wax, use non-skid floor wax.  Do not have throw rugs and other things on the floor that  can make you trip. What can I do with my stairs?  Do not leave any items on the stairs.  Make sure that there are handrails on both sides of the stairs and use them. Fix handrails that are broken or loose. Make sure that handrails are as long as the stairways.  Check any carpeting to make sure that it is firmly attached to the stairs. Fix any carpet that is loose or worn.  Avoid having throw rugs at the top or bottom of the stairs. If you do have throw rugs, attach them to the floor with carpet tape.  Make sure that you have a light switch at the top of the stairs and the bottom of the stairs. If you do not have them, ask someone to add them for you. What else can I do to help prevent falls?  Wear shoes that:  Do not have high heels.  Have rubber bottoms.  Are comfortable and fit you well.  Are closed at the toe. Do not wear sandals.  If you use a stepladder:  Make sure that it is fully opened. Do not climb a closed  stepladder.  Make sure that both sides of the stepladder are locked into place.  Ask someone to hold it for you, if possible.  Clearly mark and make sure that you can see:  Any grab bars or handrails.  First and last steps.  Where the edge of each step is.  Use tools that help you move around (mobility aids) if they are needed. These include:  Canes.  Walkers.  Scooters.  Crutches.  Turn on the lights when you go into a dark area. Replace any light bulbs as soon as they burn out.  Set up your furniture so you have a clear path. Avoid moving your furniture around.  If any of your floors are uneven, fix them.  If there are any pets around you, be aware of where they are.  Review your medicines with your doctor. Some medicines can make you feel dizzy. This can increase your chance of falling. Ask your doctor what other things that you can do to help prevent falls. This information is not intended to replace advice given to you by your health care provider. Make sure you discuss any questions you have with your health care provider. Document Released: 05/14/2009 Document Revised: 12/24/2015 Document Reviewed: 08/22/2014 Elsevier Interactive Patient Education  2017 [employer].

## 2019-09-26 NOTE — Progress Notes (Signed)
Subjective:   Savannah Whitaker is a 69 y.o. female who presents for Medicare Annual (Subsequent) preventive examination.  This visit is being conducted via phone call  - after an attmept to do on video chat - due to the COVID-19 pandemic. This patient has given me verbal consent via phone to conduct this visit, patient states they are participating from their home address. Some vital signs may be absent or patient reported.   Patient identification: identified by name, DOB, and current address.   Review of Systems:  Cardiac Risk Factors include: advanced age (>10men, >66 women);dyslipidemia;hypertension     Objective:     Vitals: LMP  (LMP Unknown)   There is no height or weight on file to calculate BMI.  Advanced Directives 09/26/2019 03/29/2018 07/08/2015  Does Patient Have a Medical Advance Directive? No No No  Would patient like information on creating a medical advance directive? - No - Patient declined Yes - Educational materials given    Tobacco Social History   Tobacco Use  Smoking Status Current Every Day Smoker  . Packs/day: 1.00  . Years: 40.00  . Pack years: 40.00  . Types: Cigarettes  Smokeless Tobacco Never Used     Ready to quit: No Counseling given: Yes   Clinical Intake:  Pre-visit preparation completed: Yes  Pain : No/denies pain     Nutritional Risks: None Diabetes: No  How often do you need to have someone help you when you read instructions, pamphlets, or other written materials from your doctor or pharmacy?: 1 - Never  Interpreter Needed?: No  Information entered by :: Nakea Gouger,LPN  Past Medical History:  Diagnosis Date  . Anxiety   . Hyperlipidemia   . Hypertension   . Hypertriglyceridemia   . IFG (impaired fasting glucose)   . Osteoarthritis   . Thyroid disease   . Tobacco use disorder   . Vitamin D deficiency    Past Surgical History:  Procedure Laterality Date  . CARPAL TUNNEL RELEASE Right   . TUBAL LIGATION      Family History  Problem Relation Age of Onset  . Lung cancer Mother   . Heart disease Father   . Colon cancer Brother   . Alcohol abuse Brother   . Cancer Brother        mouth  . Breast cancer Neg Hx    Social History   Socioeconomic History  . Marital status: Married    Spouse name: Not on file  . Number of children: Not on file  . Years of education: Not on file  . Highest education level: High school graduate  Occupational History  . Not on file  Tobacco Use  . Smoking status: Current Every Day Smoker    Packs/day: 1.00    Years: 40.00    Pack years: 40.00    Types: Cigarettes  . Smokeless tobacco: Never Used  Substance and Sexual Activity  . Alcohol use: Yes    Alcohol/week: 0.0 standard drinks    Comment: occasional wine.   . Drug use: No  . Sexual activity: Yes  Other Topics Concern  . 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    Social Determinants of Health   Financial Resource Strain:   . Difficulty of Paying Living Expenses: Not on file  Food Insecurity:   . Worried About Charity fundraiser in the Last Year: Not  on file  . Ran Out of Food in the Last Year: Not on file  Transportation Needs:   . Lack of Transportation (Medical): Not on file  . Lack of Transportation (Non-Medical): Not on file  Physical Activity:   . Days of Exercise per Week: Not on file  . Minutes of Exercise per Session: Not on file  Stress:   . Feeling of Stress : Not on file  Social Connections:   . Frequency of Communication with Friends and Family: Not on file  . Frequency of Social Gatherings with Friends and Family: Not on file  . Attends Religious Services: Not on file  . Active Member of Clubs or Organizations: Not on file  . Attends Banker Meetings: Not on file  . Marital Status: Not on file    Outpatient Encounter Medications as of 09/26/2019  Medication Sig  . amLODipine (NORVASC)  2.5 MG tablet TAKE 1 TABLET BY MOUTH EVERY DAY  . atorvastatin (LIPITOR) 40 MG tablet Take 1 tablet (40 mg total) by mouth daily at 6 PM.  . citalopram (CELEXA) 20 MG tablet TAKE 1 TABLET BY MOUTH EVERY DAY  . levothyroxine (SYNTHROID) 75 MCG tablet Take 1 tablet (75 mcg total) by mouth daily.  . metoprolol succinate (TOPROL-XL) 50 MG 24 hr tablet TAKE 1 TABLET DAILY WITH OR IMMEDIATELY FOLLOWING A MEAL.  Marland Kitchen Cholecalciferol (VITAMIN D3) 1.25 MG (50000 UT) CAPS TAKE 1 CAPSULE BY MOUTH ONCE A WEEK. FOR 8 WEEKS AND THEN STOP. RETURN TO OFFICE FOR LAB DRAW. (Patient not taking: Reported on 09/26/2019)  . nystatin cream (MYCOSTATIN) Apply 1 application topically 2 (two) times daily. (Patient not taking: Reported on 09/26/2019)  . [DISCONTINUED] Cholecalciferol 1.25 MG (50000 UT) TABS Take 1 tablet by mouth once a week. For 8 weeks and then stop.  Return to office for lab draw.   No facility-administered encounter medications on file as of 09/26/2019.    Activities of Daily Living In your present state of health, do you have any difficulty performing the following activities: 09/26/2019 05/29/2019  Hearing? N N  Comment no hearing aids -  Vision? N N  Comment eyeglasses, Dr.Barker -  Difficulty concentrating or making decisions? N N  Walking or climbing stairs? N N  Dressing or bathing? N N  Doing errands, shopping? N N  Preparing Food and eating ? N -  Using the Toilet? N -  In the past six months, have you accidently leaked urine? N -  Comment wear pads for protection -  Do you have problems with loss of bowel control? N -  Managing your Medications? N -  Managing your Finances? N -  Housekeeping or managing your Housekeeping? N -  Some recent data might be hidden    Patient Care Team: Marjie Skiff, NP as PCP - General (Nurse Practitioner) Antonieta Iba, MD as Consulting Physician (Cardiology)    Assessment:   This is a routine wellness examination for Savannah Whitaker.  Exercise  Activities and Dietary recommendations Current Exercise Habits: The patient does not participate in regular exercise at present, Exercise limited by: None identified  Goals    . Quit Smoking     Smoking cessation discussed       Fall Risk: Fall Risk  09/26/2019 05/29/2019 03/29/2018 03/29/2018 03/29/2018  Falls in the past year? 0 0 No No No  Number falls in past yr: 0 0 - - -  Injury with Fall? 0 0 - - -  Follow  up - Falls evaluation completed - - -    FALL RISK PREVENTION PERTAINING TO THE HOME:  Any stairs in or around the home? Yes steps outside  If so, are there any without handrails? No   Home free of loose throw rugs in walkways, pet beds, electrical cords, etc? Yes  Adequate lighting in your home to reduce risk of falls? Yes   ASSISTIVE DEVICES UTILIZED TO PREVENT FALLS:  Life alert? No  Use of a cane, walker or w/c? No  Grab bars in the bathroom? No  Shower chair or bench in shower? No  Elevated toilet seat or a handicapped toilet? No   DME ORDERS:  DME order needed?  No   TIMED UP AND GO:  Unable to perform    Depression Screen PHQ 2/9 Scores 09/26/2019 05/29/2019 03/29/2018 03/29/2018  PHQ - 2 Score 0 2 0 0  PHQ- 9 Score - 3 0 0     Cognitive Function     6CIT Screen 03/29/2018  What Year? 0 points  What month? 0 points  What time? 0 points  Count back from 20 0 points  Months in reverse 0 points  Repeat phrase 0 points  Total Score 0    Immunization History  Administered Date(s) Administered  . Fluad Quad(high Dose 65+) 05/29/2019  . Influenza, High Dose Seasonal PF 07/13/2017, 06/01/2018  . Td 06/10/2005    Qualifies for Shingles Vaccine? Yes  declined   Tdap: Discussed need for TD/TDAP vaccine, patient verbalized understanding that this is not covered as a preventative with there insurance and to call the office if she develops any new skin injuries, ie: cuts, scrapes, bug bites, or open wounds..  Flu Vaccine: up to date   Pneumococcal  Vaccine: declined    Covid-19: information given.   Screening Tests Health Maintenance  Topic Date Due  . MAMMOGRAM  05/28/2020 (Originally 07/23/2001)  . DEXA SCAN  05/28/2020 (Originally 07/23/2016)  . TETANUS/TDAP  05/28/2020 (Originally 06/11/2015)  . PNA vac Low Risk Adult (1 of 2 - PCV13) 05/28/2020 (Originally 07/23/2016)  . COLONOSCOPY  09/25/2020 (Originally 07/23/2001)  . INFLUENZA VACCINE  Completed  . Hepatitis C Screening  Completed    Cancer Screenings:  Colorectal Screening:  Declined, has fecal occult at home   Mammogram: declined   Bone Density: declined   Lung Cancer Screening: (Low Dose CT Chest recommended if Age 29-80 years, 30 pack-year currently smoking OR have quit w/in 15years.) does  qualify.   Completed 05/2019  Additional Screening:  Hepatitis C Screening: does not qualify; Completed 2016  Vision Screening: Recommended annual ophthalmology exams for early detection of glaucoma and other disorders of the eye. Is the patient up to date with their annual eye exam?  Yes  Who is the provider or what is the name of the office in which the pt attends annual eye exams? Dr.barker  Dental Screening: Recommended annual dental exams for proper oral hygiene  Community Resource Referral:  CRR required this visit?  No       Plan:  I have personally reviewed and addressed the Medicare Annual Wellness questionnaire and have noted the following in the patient's chart:  A. Medical and social history B. Use of alcohol, tobacco or illicit drugs  C. Current medications and supplements D. Functional ability and status E.  Nutritional status F.  Physical activity G. Advance directives H. List of other physicians I.  Hospitalizations, surgeries, and ER visits in previous 12 months J.  Vitals  K. Screenings such as hearing and vision if needed, cognitive and depression L. Referrals and appointments   In addition, I have reviewed and discussed with patient  certain preventive protocols, quality metrics, and best practice recommendations. A written personalized care plan for preventive services as well as general preventive health recommendations were provided to patient.  Signed,    Collene Schlichter, LPN  11/25/8339 Nurse Health Advisor   Nurse Notes: none

## 2019-09-28 ENCOUNTER — Other Ambulatory Visit: Payer: Self-pay | Admitting: Nurse Practitioner

## 2019-09-28 NOTE — Telephone Encounter (Signed)
Requested Prescriptions  Pending Prescriptions Disp Refills  . citalopram (CELEXA) 20 MG tablet [Pharmacy Med Name: CITALOPRAM HBR 20 MG TABLET] 90 tablet 1    Sig: TAKE 1 TABLET BY MOUTH EVERY DAY     Psychiatry:  Antidepressants - SSRI Passed - 09/28/2019  9:43 AM      Passed - Valid encounter within last 6 months    Recent Outpatient Visits          2 months ago Pulmonary emphysema, unspecified emphysema type (HCC)   [facility] Cannady, Dorie Rank, NP   4 months ago Annual physical exam   [facility] [place], Corrie Dandy T, NP   1 year ago Annual physical exam   [facility] Trey Sailors, PA-C   1 year ago Fatigue, unspecified type   [facility] Gabriel Cirri, NP   2 years ago Cough   [facility] Gabriel Cirri, NP      Future Appointments            In 1 week Cannady, Dorie Rank, NP [facility]   In 1 year  [facility]

## 2019-10-11 ENCOUNTER — Other Ambulatory Visit: Payer: Self-pay

## 2019-10-11 ENCOUNTER — Ambulatory Visit (INDEPENDENT_AMBULATORY_CARE_PROVIDER_SITE_OTHER): Payer: Medicare HMO | Admitting: Nurse Practitioner

## 2019-10-11 ENCOUNTER — Encounter: Payer: Self-pay | Admitting: Nurse Practitioner

## 2019-10-11 VITALS — BP 136/78 | HR 97 | Temp 98.5°F

## 2019-10-11 DIAGNOSIS — E782 Mixed hyperlipidemia: Secondary | ICD-10-CM | POA: Diagnosis not present

## 2019-10-11 DIAGNOSIS — I10 Essential (primary) hypertension: Secondary | ICD-10-CM

## 2019-10-11 DIAGNOSIS — E559 Vitamin D deficiency, unspecified: Secondary | ICD-10-CM

## 2019-10-11 NOTE — Assessment & Plan Note (Signed)
Chronic, ongoing.  Continue Atorvastatin 40 MG and recheck lipid today (fasting).  Adjust as needed. Recent triglycerides elevated, was no fasting.  If continue elevation consider addition of Fenofibrate to regimen.

## 2019-10-11 NOTE — Assessment & Plan Note (Signed)
Chronic, ongoing with BP above goal on initial BP and improved close to goal on repeat (trending down with each check). Continue Metoprolol and Amlodipine.  Recommend checking BP daily at home and documenting for provider.  Return in 6 months

## 2019-10-11 NOTE — Patient Instructions (Addendum)
Start taking Vitamin D 1000 units daily, can get over the counter.   Vitamin D Deficiency Vitamin D deficiency is when your body does not have enough vitamin D. Vitamin D is important to your body because:  It helps your body use other minerals.  It helps to keep your bones strong and healthy.  It may help to prevent some diseases.  It helps your heart and other muscles work well. Not getting enough vitamin D can make your bones soft. It can also cause other health problems. What are the causes? This condition may be caused by:  Not eating enough foods that contain vitamin D.  Not getting enough sun.  Having diseases that make it hard for your body to absorb vitamin D.  Having a surgery in which a part of the stomach or a part of the small intestine is removed.  Having kidney disease or liver disease. What increases the risk? You are more likely to get this condition if:  You are older.  You do not spend much time outdoors.  You live in a nursing home.  You have had broken bones.  You have weak or thin bones (osteoporosis).  You have a disease or condition that changes how your body absorbs vitamin D.  You have dark skin.  You take certain medicines.  You are overweight or obese. What are the signs or symptoms?  In mild cases, there may not be any symptoms. If the condition is very bad, symptoms may include: ? Bone pain. ? Muscle pain. ? Falling often. ? Broken bones caused by a minor injury. How is this treated? Treatment may include taking supplements as told by your doctor. Your doctor will tell you what dose is best for you. Supplements may include:  Vitamin D.  Calcium. Follow these instructions at home: Eating and drinking   Eat foods that contain vitamin D, such as: ? Dairy products, cereals, or juices with added vitamin D. Check the label. ? Fish, such as salmon or trout. ? Eggs. ? Oysters. ? Mushrooms. The items listed above may not be a  complete list of what you can eat and drink. Contact a dietitian for more options. General instructions  Take medicines and supplements only as told by your doctor.  Get regular, safe exposure to natural sunlight.  Do not use a tanning bed.  Maintain a healthy weight. Lose weight if needed.  Keep all follow-up visits as told by your doctor. This is important. How is this prevented?  You can get vitamin D by: ? Eating foods that naturally contain vitamin D. ? Eating or drinking products that have vitamin D added to them, such as cereals, juices, and milk. ? Taking vitamin D or a multivitamin that contains vitamin D. ? Being in the sun. Your body makes vitamin D when your skin is exposed to sunlight. Your body changes the sunlight into a form of the vitamin that it can use. Contact a doctor if:  Your symptoms do not go away.  You feel sick to your stomach (nauseous).  You throw up (vomit).  You poop less often than normal, or you have trouble pooping (constipation). Summary  Vitamin D deficiency is when your body does not have enough vitamin D.  Vitamin D helps to keep your bones strong and healthy.  This condition is often treated by taking a supplement.  Your doctor will tell you what dose is best for you. This information is not intended to replace advice given  to you by your health care provider. Make sure you discuss any questions you have with your health care provider. Document Revised: 03/26/2018 Document Reviewed: 03/26/2018 Elsevier Patient Education  [place].

## 2019-10-11 NOTE — Assessment & Plan Note (Addendum)
Continue supplement and recheck Vit D today.  Recommend change to daily Vitamin D 3 1000 units.

## 2019-10-11 NOTE — Progress Notes (Signed)
BP 136/78 (BP Location: Left Arm, Patient Position: Sitting)   Pulse 97   Temp 98.5 F (36.9 C) (Oral)   LMP  (LMP Unknown)   SpO2 97%    Subjective:    Patient ID: Savannah Whitaker, female    DOB: Jul 19, 1951, 69 y.o.   MRN: [id number]  HPI: Savannah Whitaker is a 69 y.o. female  Chief Complaint  Patient presents with  . Vitamin D deficiency   VITAMIN D DEFICIENCY: Taking weekly high dose Vitamin D with last level 28.2, improved from previous at 14.3.  Denies any recent falls, fractures, or muscle pain.    HYPERTENSION / HYPERLIPIDEMIA Continues on Atorvastatin, Amlodipine (restarted last visit), and Metoprolol.  On recent labs Trigs were 697, non fasting at time.  LDL 62 and TCHOL 196.  On recent CT imaging lung two-vessel coronary atherosclerosis was noted, similar was noted on previous exam in 2018 and 2019.   Satisfied with current treatment? yes Duration of hypertension: chronic BP monitoring frequency: not checking BP range: not checking BP medication side effects: no Duration of hyperlipidemia: chronic Cholesterol medication side effects: no Cholesterol supplements: none Medication compliance: good compliance Aspirin: no Recent stressors: no Recurrent headaches: no Visual changes: no Palpitations: no Dyspnea: no Chest pain: no Lower extremity edema: no Dizzy/lightheaded: no    Relevant past medical, surgical, family and social history reviewed and updated as indicated. Interim medical history since our last visit reviewed. Allergies and medications reviewed and updated.  Review of Systems  Constitutional: Negative for activity change, appetite change, diaphoresis, fatigue and fever.  Respiratory: Negative for cough, chest tightness and shortness of breath.   Cardiovascular: Negative for chest pain, palpitations and leg swelling.  Gastrointestinal: Negative.   Neurological: Negative.   Psychiatric/Behavioral: Negative.     Per HPI unless  specifically indicated above     Objective:    BP 136/78 (BP Location: Left Arm, Patient Position: Sitting)   Pulse 97   Temp 98.5 F (36.9 C) (Oral)   LMP  (LMP Unknown)   SpO2 97%   Wt Readings from Last 3 Encounters:  05/29/19 131 lb 3.2 oz (59.5 kg)  05/09/19 126 lb (57.2 kg)  03/29/18 126 lb 1.6 oz (57.2 kg)    Physical Exam Vitals and nursing note reviewed.  Constitutional:      General: She is awake. She is not in acute distress.    Appearance: She is well-developed and well-groomed. She is not ill-appearing.  HENT:     Head: Normocephalic.     Right Ear: Hearing normal.     Left Ear: Hearing normal.  Eyes:     General: Lids are normal.        Right eye: No discharge.        Left eye: No discharge.     Conjunctiva/sclera: Conjunctivae normal.     Pupils: Pupils are equal, round, and reactive to light.  Neck:     Vascular: No carotid bruit.  Cardiovascular:     Rate and Rhythm: Normal rate and regular rhythm.     Heart sounds: Normal heart sounds. No murmur. No gallop.   Pulmonary:     Effort: Pulmonary effort is normal. No accessory muscle usage or respiratory distress.     Breath sounds: Normal breath sounds.  Abdominal:     General: Bowel sounds are normal.     Palpations: Abdomen is soft.  Musculoskeletal:     Cervical back: Normal range of motion and neck supple.  Right lower leg: No edema.     Left lower leg: No edema.  Skin:    General: Skin is warm and dry.  Neurological:     Mental Status: She is alert and oriented to person, place, and time.  Psychiatric:        Attention and Perception: Attention normal.        Mood and Affect: Mood normal.        Speech: Speech normal.        Behavior: Behavior normal. Behavior is cooperative.    Results for orders placed or performed in visit on 07/01/19  Lipid Panel w/o Chol/HDL Ratio out  Result Value Ref Range   Cholesterol, Total 196 100 - 199 mg/dL   Triglycerides 154 (HH) 0 - 149 mg/dL   HDL 29  (L) >00 mg/dL   VLDL Cholesterol Cal 105 (H) 5 - 40 mg/dL   LDL Chol Calc (NIH) 62 0 - 99 mg/dL  Vit D  25 hydroxy (rtn osteoporosis monitoring)  Result Value Ref Range   Vit D, 25-Hydroxy 28.2 (L) 30.0 - 100.0 ng/mL  Comprehensive metabolic panel  Result Value Ref Range   Glucose 88 65 - 99 mg/dL   BUN 16 8 - 27 mg/dL   Creatinine, Ser 8.67 0.57 - 1.00 mg/dL   GFR calc non Af Amer 75 >59 mL/min/1.73   GFR calc Af Amer 87 >59 mL/min/1.73   BUN/Creatinine Ratio 20 12 - 28   Sodium 140 134 - 144 mmol/L   Potassium 4.0 3.5 - 5.2 mmol/L   Chloride 101 96 - 106 mmol/L   CO2 21 20 - 29 mmol/L   Calcium 9.3 8.7 - 10.3 mg/dL   Total Protein 6.8 6.0 - 8.5 g/dL   Albumin 4.1 3.8 - 4.8 g/dL   Globulin, Total 2.7 1.5 - 4.5 g/dL   Albumin/Globulin Ratio 1.5 1.2 - 2.2   Bilirubin Total 0.3 0.0 - 1.2 mg/dL   Alkaline Phosphatase 73 39 - 117 IU/L   AST 11 0 - 40 IU/L   ALT 10 0 - 32 IU/L      Assessment & Plan:   Problem List Items Addressed This Visit      Cardiovascular and Mediastinum   Hypertension - Primary    Chronic, ongoing with BP above goal on initial BP and improved close to goal on repeat (trending down with each check). Continue Metoprolol and Amlodipine.  Recommend checking BP daily at home and documenting for provider.  Return in 6 months        Other   Hyperlipidemia    Chronic, ongoing.  Continue Atorvastatin 40 MG and recheck lipid today (fasting).  Adjust as needed. Recent triglycerides elevated, was no fasting.  If continue elevation consider addition of Fenofibrate to regimen.      Relevant Orders   Lipid Panel w/o Chol/HDL Ratio   Vitamin D deficiency    Continue supplement and recheck Vit D today.  Recommend change to daily Vitamin D 3 1000 units.      Relevant Orders   VITAMIN D 25 Hydroxy (Vit-D Deficiency, Fractures)       Follow up plan: Return in about 6 months (around 04/12/2020) for HTN/HLD, COPD, Vit D.

## 2019-10-12 ENCOUNTER — Other Ambulatory Visit: Payer: Self-pay | Admitting: Nurse Practitioner

## 2019-10-12 LAB — LIPID PANEL W/O CHOL/HDL RATIO
Cholesterol, Total: 199 mg/dL (ref 100–199)
HDL: 31 mg/dL — ABNORMAL LOW (ref >39)
LDL Chol Calc (NIH): 80 mg/dL (ref 0–99)
Triglycerides: 549 mg/dL — ABNORMAL HIGH (ref 0–149)
VLDL Cholesterol Cal: 88 mg/dL — ABNORMAL HIGH (ref 5–40)

## 2019-10-12 LAB — VITAMIN D 25 HYDROXY (VIT D DEFICIENCY, FRACTURES): Vit D, 25-Hydroxy: 41.6 ng/mL (ref 30.0–100.0)

## 2019-10-12 MED ORDER — CHOLECALCIFEROL 25 MCG (1000 UT) PO TABS: 1000.0000 [IU] | ORAL_TABLET | Freq: Every day | ORAL | 3 refills | Status: DC

## 2019-10-12 NOTE — Progress Notes (Signed)
Good morning.  Please let Savannah Whitaker know her labs have returned: - Vitamin D level has improved.  I would like her to start taking Vitamin D3 1000 units daily at this time.  I can order this for her to make it easier to obtain.   - Cholesterol levels remain above goal.  I would like to go up on her Atorvastatin to max dose to help with stroke prevention and it may help get levels to goal.  Please let me know if this is okay and I will send in new dose, then will recheck levels next visit.   Have a great day!!!

## 2020-02-20 ENCOUNTER — Other Ambulatory Visit: Payer: Self-pay | Admitting: Nurse Practitioner

## 2020-03-06 ENCOUNTER — Other Ambulatory Visit: Payer: Self-pay | Admitting: Nurse Practitioner

## 2020-03-06 DIAGNOSIS — I10 Essential (primary) hypertension: Secondary | ICD-10-CM

## 2020-04-13 ENCOUNTER — Ambulatory Visit: Payer: Medicare HMO | Admitting: Nurse Practitioner

## 2020-04-30 ENCOUNTER — Telehealth: Payer: Self-pay | Admitting: *Deleted

## 2020-04-30 NOTE — Telephone Encounter (Signed)
Attempted to contact and schedule lung screening scan. Message left for patient to call back to schedule. 

## 2020-06-02 ENCOUNTER — Other Ambulatory Visit: Payer: Self-pay | Admitting: Nurse Practitioner

## 2020-06-02 NOTE — Telephone Encounter (Signed)
Patient needs to be contacted to schedule

## 2020-06-02 NOTE — Telephone Encounter (Signed)
Patient called and advised she will need an appointment for physical and labs to continue to refill medications, she agrees. Appointment scheduled for Thursday, 06/04/20 at 1040, advised no food or drink other than water for fasting labs. Patient verbalized understanding.

## 2020-06-03 ENCOUNTER — Telehealth: Payer: Self-pay | Admitting: *Deleted

## 2020-06-03 DIAGNOSIS — Z87891 Personal history of nicotine dependence: Secondary | ICD-10-CM

## 2020-06-03 DIAGNOSIS — Z122 Encounter for screening for malignant neoplasm of respiratory organs: Secondary | ICD-10-CM

## 2020-06-03 NOTE — Telephone Encounter (Signed)
Contacted and scheduled. Current smoker, 43 pack year

## 2020-06-04 ENCOUNTER — Ambulatory Visit: Admission: RE | Admit: 2020-06-04 | Payer: Medicare HMO | Source: Ambulatory Visit

## 2020-06-04 ENCOUNTER — Encounter: Payer: Self-pay | Admitting: Nurse Practitioner

## 2020-06-05 ENCOUNTER — Ambulatory Visit
Admission: RE | Admit: 2020-06-05 | Discharge: 2020-06-05 | Disposition: A | Discharge status: Home / Self Care | Payer: Medicare HMO | Source: Ambulatory Visit | Attending: Nurse Practitioner | Admitting: Nurse Practitioner

## 2020-06-05 ENCOUNTER — Other Ambulatory Visit: Payer: Self-pay

## 2020-06-05 DIAGNOSIS — Z87891 Personal history of nicotine dependence: Secondary | ICD-10-CM | POA: Diagnosis not present

## 2020-06-05 DIAGNOSIS — Z122 Encounter for screening for malignant neoplasm of respiratory organs: Secondary | ICD-10-CM | POA: Diagnosis not present

## 2020-06-08 ENCOUNTER — Encounter: Payer: Self-pay | Admitting: *Deleted

## 2020-06-19 DIAGNOSIS — R69 Illness, unspecified: Secondary | ICD-10-CM | POA: Diagnosis not present

## 2020-08-28 ENCOUNTER — Other Ambulatory Visit: Payer: Self-pay | Admitting: Nurse Practitioner

## 2020-08-29 ENCOUNTER — Other Ambulatory Visit: Payer: Self-pay | Admitting: Nurse Practitioner

## 2020-08-29 DIAGNOSIS — I10 Essential (primary) hypertension: Secondary | ICD-10-CM

## 2020-08-29 NOTE — Telephone Encounter (Signed)
Requested Prescriptions  Pending Prescriptions Disp Refills  . amLODipine (NORVASC) 2.5 MG tablet [Pharmacy Med Name: AMLODIPINE BESYLATE 2.5 MG TAB] 30 tablet 0    Sig: TAKE 1 TABLET BY MOUTH EVERY DAY     Cardiovascular:  Calcium Channel Blockers Failed - 08/29/2020  9:33 AM      Failed - Valid encounter within last 6 months    Recent Outpatient Visits          10 months ago Essential hypertension   [facility] [place], Corrie Dandy T, NP   1 year ago Pulmonary emphysema, unspecified emphysema type (HCC)   [facility] [place], Dorie Rank, NP   1 year ago Annual physical exam   [facility] [place], Dorie Rank, NP   2 years ago Annual physical exam   [facility] Osvaldo Angst M, PA-C   2 years ago Fatigue, unspecified type   [facility] Gabriel Cirri, NP      Future Appointments            In 1 month [facility]            Passed - Last BP in normal range    BP Readings from Last 1 Encounters:  10/11/19 136/78

## 2020-09-22 ENCOUNTER — Telehealth: Payer: Self-pay | Admitting: Nurse Practitioner

## 2020-09-22 NOTE — Telephone Encounter (Signed)
Copied from CRM [id number]. Topic: Medicare AWV >> Sep 22, 2020  2:19 PM Geraldine Contras wrote: Reason for CRM: Left message for patient - AWVS has been reschedule from March 2nd to Feb 28th at 11:15 and will be completed by a phone call not in the office -srs

## 2020-09-24 ENCOUNTER — Other Ambulatory Visit: Payer: Self-pay | Admitting: Nurse Practitioner

## 2020-09-24 DIAGNOSIS — I10 Essential (primary) hypertension: Secondary | ICD-10-CM

## 2020-09-24 NOTE — Telephone Encounter (Signed)
Courtesy refill appt in 4 days last encounter 11 months ago

## 2020-09-25 ENCOUNTER — Other Ambulatory Visit: Payer: Self-pay | Admitting: Nurse Practitioner

## 2020-09-28 ENCOUNTER — Ambulatory Visit (INDEPENDENT_AMBULATORY_CARE_PROVIDER_SITE_OTHER): Payer: Medicare HMO

## 2020-09-28 VITALS — Ht <= 58 in | Wt 130.0 lb

## 2020-09-28 DIAGNOSIS — Z0000 Encounter for general adult medical examination without abnormal findings: Secondary | ICD-10-CM

## 2020-09-28 NOTE — Progress Notes (Signed)
I connected with Savannah Whitaker today by telephone and verified that I am speaking with the correct person using two identifiers. Location patient: home Location provider: work Persons participating in the virtual visit: Savannah Whitaker, Elisha Ponder LPN.   I discussed the limitations, risks, security and privacy concerns of performing an evaluation and management service by telephone and the availability of in person appointments. I also discussed with the patient that there may be a patient responsible charge related to this service. The patient expressed understanding and verbally consented to this telephonic visit.    Interactive audio and video telecommunications were attempted between this provider and patient, however failed, due to patient having technical difficulties OR patient did not have access to video capability.  We continued and completed visit with audio only.     Vital signs may be patient reported or missing.  Subjective:   Savannah Whitaker is a 70 y.o. female who presents for Medicare Annual (Subsequent) preventive examination.  Review of Systems     Cardiac Risk Factors include: advanced age (>35men, >72 women);hypertension;sedentary lifestyle;smoking/ tobacco exposure;dyslipidemia     Objective:    Today's Vitals   09/28/20 1111  Weight: 130 lb (59 kg)  Height: 4' 9.5" (1.461 m)   Body mass index is 27.64 kg/m.  Advanced Directives 09/28/2020 09/26/2019 03/29/2018 07/08/2015  Does Patient Have a Medical Advance Directive? No No No No  Would patient like information on creating a medical advance directive? - - No - Patient declined Yes - Educational materials given    Current Medications (verified) Outpatient Encounter Medications as of 09/28/2020  Medication Sig  . amLODipine (NORVASC) 2.5 MG tablet TAKE 1 TABLET BY MOUTH EVERY DAY  . atorvastatin (LIPITOR) 40 MG tablet TAKE 1 TABLET (40 MG TOTAL) BY MOUTH DAILY AT 6 PM.  . citalopram (CELEXA) 20 MG tablet  TAKE 1 TABLET BY MOUTH EVERY DAY  . levothyroxine (SYNTHROID) 75 MCG tablet TAKE 1 TABLET BY MOUTH EVERY DAY  . metoprolol succinate (TOPROL-XL) 50 MG 24 hr tablet TAKE 1 TABLET DAILY WITH OR IMMEDIATELY FOLLOWING A MEAL.  Marland Kitchen nystatin cream (MYCOSTATIN) Apply 1 application topically 2 (two) times daily.  . Cholecalciferol 25 MCG (1000 UT) tablet Take 1 tablet (1,000 Units total) by mouth daily. (Patient not taking: Reported on 09/28/2020)   No facility-administered encounter medications on file as of 09/28/2020.    Allergies (verified) Patient has no known allergies.   History: Past Medical History:  Diagnosis Date  . Anxiety   . Hyperlipidemia   . Hypertension   . Hypertriglyceridemia   . IFG (impaired fasting glucose)   . Osteoarthritis   . Thyroid disease   . Tobacco use disorder   . Vitamin D deficiency    Past Surgical History:  Procedure Laterality Date  . CARPAL TUNNEL RELEASE Right   . TUBAL LIGATION     Family History  Problem Relation Age of Onset  . Lung cancer Mother   . Heart disease Father   . Colon cancer Brother   . Alcohol abuse Brother   . Cancer Brother        mouth  . Breast cancer Neg Hx    Social History   Socioeconomic History  . Marital status: Widowed    Spouse name: Not on file  . Number of children: Not on file  . Years of education: Not on file  . Highest education level: High school graduate  Occupational History  . Occupation: semi retired  Tobacco Use  .  Smoking status: Current Every Day Smoker    Packs/day: 1.00    Years: 40.00    Pack years: 40.00    Types: Cigarettes  . Smokeless tobacco: Never Used  Vaping Use  . Vaping Use: Never used  Substance and Sexual Activity  . Alcohol use: Yes    Alcohol/week: 0.0 standard drinks    Comment: occasional wine.   . Drug use: No  . Sexual activity: Not Currently  Other Topics Concern  . 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    Social Determinants of Health   Financial Resource Strain: Low Risk   . Difficulty of Paying Living Expenses: Not hard at all  Food Insecurity: No Food Insecurity  . Worried About Programme researcher, broadcasting/film/video in the Last Year: Never true  . Ran Out of Food in the Last Year: Never true  Transportation Needs: No Transportation Needs  . Lack of Transportation (Medical): No  . Lack of Transportation (Non-Medical): No  Physical Activity: Inactive  . Days of Exercise per Week: 0 days  . Minutes of Exercise per Session: 0 min  Stress: No Stress Concern Present  . Feeling of Stress : Not at all  Social Connections: Not on file    Tobacco Counseling Ready to quit: Not Answered Counseling given: Not Answered   Clinical Intake:  Pre-visit preparation completed: Yes  Pain : No/denies pain     Nutritional Status: BMI 25 -29 Overweight Nutritional Risks: None Diabetes: No  How often do you need to have someone help you when you read instructions, pamphlets, or other written materials from your doctor or pharmacy?: 1 - Never What is the last grade level you completed in school?: 12th grade  Diabetic? no  Interpreter Needed?: No  Information entered by :: NAllen LPN   Activities of Daily Living In your present state of health, do you have any difficulty performing the following activities: 09/28/2020  Hearing? N  Vision? N  Difficulty concentrating or making decisions? N  Walking or climbing stairs? N  Dressing or bathing? N  Doing errands, shopping? N  Preparing Food and eating ? N  Using the Toilet? N  In the past six months, have you accidently leaked urine? N  Do you have problems with loss of bowel control? N  Managing your Medications? N  Managing your Finances? N  Housekeeping or managing your Housekeeping? N  Some recent data might be hidden    Patient Care Team: Marjie Skiff, NP as PCP - General (Nurse Practitioner) Antonieta Iba, MD as  Consulting Physician (Cardiology)  Indicate any recent Medical Services you may have received from other than [facility] providers in the past year (date may be approximate).     Assessment:   This is a routine wellness examination for Savannah Whitaker.  Hearing/Vision screen  Hearing Screening   125Hz  250Hz  500Hz  1000Hz  2000Hz  3000Hz  4000Hz  6000Hz  8000Hz   Right ear:           Left ear:           Vision Screening Comments: No regular eye exams,   Dietary issues and exercise activities discussed: Current Exercise Habits: The patient has a physically strenuous job, but has no regular exercise apart from work.  Goals    . Patient Stated     09/28/2020, eat healthier    . Quit Smoking     Smoking cessation  discussed      Depression Screen PHQ 2/9 Scores 09/28/2020 09/26/2019 05/29/2019 03/29/2018 03/29/2018 03/29/2018 02/26/2018  PHQ - 2 Score 0 0 2 0 0 0 1  PHQ- 9 Score - - 3 0 0 - 3    Fall Risk Fall Risk  09/28/2020 09/26/2019 05/29/2019 03/29/2018 03/29/2018  Falls in the past year? 1 0 0 No No  Comment tripped over curb - - - -  Number falls in past yr: 1 0 0 - -  Injury with Fall? 0 0 0 - -  Risk for fall due to : Medication side effect - - - -  Follow up Falls evaluation completed;Education provided;Falls prevention discussed - Falls evaluation completed - -    FALL RISK PREVENTION PERTAINING TO THE HOME:  Any stairs in or around the home? Yes  If so, are there any without handrails? No  Home free of loose throw rugs in walkways, pet beds, electrical cords, etc? Yes  Adequate lighting in your home to reduce risk of falls? Yes   ASSISTIVE DEVICES UTILIZED TO PREVENT FALLS:  Life alert? No  Use of a cane, walker or w/c? No  Grab bars in the bathroom? No  Shower chair or bench in shower? No  Elevated toilet seat or a handicapped toilet? No   TIMED UP AND GO:  Was the test performed? No   Cognitive Function:     6CIT Screen 09/28/2020 03/29/2018  What Year? 0 points 0 points  What  month? 0 points 0 points  What time? 0 points 0 points  Count back from 20 0 points 0 points  Months in reverse 0 points 0 points  Repeat phrase 2 points 0 points  Total Score 2 0    Immunizations Immunization History  Administered Date(s) Administered  . Fluad Quad(high Dose 65+) 05/29/2019  . Influenza, High Dose Seasonal PF 07/13/2017, 06/01/2018  . Td 06/10/2005    TDAP status: Due, Education has been provided regarding the importance of this vaccine. Advised may receive this vaccine at local pharmacy or Health Dept. Aware to provide a copy of the vaccination record if obtained from local pharmacy or Health Dept. Verbalized acceptance and understanding.  Flu Vaccine status: Up to date  Pneumococcal vaccine status: Due, Education has been provided regarding the importance of this vaccine. Advised may receive this vaccine at local pharmacy or Health Dept. Aware to provide a copy of the vaccination record if obtained from local pharmacy or Health Dept. Verbalized acceptance and understanding.  Covid-19 vaccine status: Declined, Education has been provided regarding the importance of this vaccine but patient still declined. Advised may receive this vaccine at local pharmacy or Health Dept.or vaccine clinic. Aware to provide a copy of the vaccination record if obtained from local pharmacy or Health Dept. Verbalized acceptance and understanding.  Qualifies for Shingles Vaccine? Yes   Zostavax completed No   Shingrix Completed?: No.    Education has been provided regarding the importance of this vaccine. Patient has been advised to call insurance company to determine out of pocket expense if they have not yet received this vaccine. Advised may also receive vaccine at local pharmacy or Health Dept. Verbalized acceptance and understanding.  Screening Tests Health Maintenance  Topic Date Due  . COVID-19 Vaccine (1) Never done  . COLONOSCOPY (Pts 45-66yrs Insurance coverage will need to be  confirmed)  Never done  . MAMMOGRAM  Never done  . TETANUS/TDAP  06/11/2015  . DEXA SCAN  Never done  .  PNA vac Low Risk Adult (1 of 2 - PCV13) Never done  . INFLUENZA VACCINE  03/01/2020  . Hepatitis C Screening  Completed    Health Maintenance  Health Maintenance Due  Topic Date Due  . COVID-19 Vaccine (1) Never done  . COLONOSCOPY (Pts 45-3237yrs Insurance coverage will need to be confirmed)  Never done  . MAMMOGRAM  Never done  . TETANUS/TDAP  06/11/2015  . DEXA SCAN  Never done  . PNA vac Low Risk Adult (1 of 2 - PCV13) Never done  . INFLUENZA VACCINE  03/01/2020    Colorectal cancer screening: decline  Mammogram status: decline  Bone Density status: Completed 08/08/2005.  Lung Cancer Screening: (Low Dose CT Chest recommended if Age 68-80 years, 30 pack-year currently smoking OR have quit w/in 15years.) does qualify.   Lung Cancer Screening Referral: had CT scan in November  Additional Screening:  Hepatitis C Screening: does qualify; Completed 05/27/2015  Vision Screening: Recommended annual ophthalmology exams for early detection of glaucoma and other disorders of the eye. Is the patient up to date with their annual eye exam?  No  Who is the provider or what is the name of the office in which the patient attends annual eye exams?  If pt is not established with a provider, would they like to be referred to a provider to establish care? No .   Dental Screening: Recommended annual dental exams for proper oral hygiene  Community Resource Referral / Chronic Care Management: CRR required this visit?  No   CCM required this visit?  No      Plan:     I have personally reviewed and noted the following in the patient's chart:   . Medical and social history . Use of alcohol, tobacco or illicit drugs  . Current medications and supplements . Functional ability and status . Nutritional status . Physical activity . Advanced directives . List of other  physicians . Hospitalizations, surgeries, and ER visits in previous 12 months . Vitals . Screenings to include cognitive, depression, and falls . Referrals and appointments  In addition, I have reviewed and discussed with patient certain preventive protocols, quality metrics, and best practice recommendations. A written personalized care plan for preventive services as well as general preventive health recommendations were provided to patient.     Barb Merinoickeah E Allen, LPN   0/10/27252/28/2022   Nurse Notes:

## 2020-09-28 NOTE — Patient Instructions (Signed)
[name] , Thank you for taking time to come for your Medicare Wellness Visit. I appreciate your ongoing commitment to your health goals. Please review the following plan we discussed and let me know if I can assist you in the future.   Screening recommendations/referrals: Colonoscopy: decline Mammogram: decline Bone Density: completed 08/08/2005 Recommended yearly ophthalmology/optometry visit for glaucoma screening and checkup Recommended yearly dental visit for hygiene and checkup  Vaccinations: Influenza vaccine: completed 05/2020 per patient Pneumococcal vaccine: due Tdap vaccine: due Shingles vaccine: discussed   Covid-19: decline  Advanced directives: Advance directive discussed with you today.   Conditions/risks identified: smoking  Next appointment: Follow up in one year for your annual wellness visit    Preventive Care 65 Years and Older, Female Preventive care refers to lifestyle choices and visits with your health care provider that can promote health and wellness. What does preventive care include?  A yearly physical exam. This is also called an annual well check.  Dental exams once or twice a year.  Routine eye exams. Ask your health care provider how often you should have your eyes checked.  Personal lifestyle choices, including:  Daily care of your teeth and gums.  Regular physical activity.  Eating a healthy diet.  Avoiding tobacco and drug use.  Limiting alcohol use.  Practicing safe sex.  Taking low-dose aspirin every day.  Taking vitamin and mineral supplements as recommended by your health care provider. What happens during an annual well check? The services and screenings done by your health care provider during your annual well check will depend on your age, overall health, lifestyle risk factors, and family history of disease. Counseling  Your health care provider may ask you questions about your:  Alcohol use.  Tobacco use.  Drug  use.  Emotional well-being.  Home and relationship well-being.  Sexual activity.  Eating habits.  History of falls.  Memory and ability to understand (cognition).  Work and work Astronomer.  Reproductive health. Screening  You may have the following tests or measurements:  Height, weight, and BMI.  Blood pressure.  Lipid and cholesterol levels. These may be checked every 5 years, or more frequently if you are over 30 years old.  Skin check.  Lung cancer screening. You may have this screening every year starting at age 59 if you have a 30-pack-year history of smoking and currently smoke or have quit within the past 15 years.  Fecal occult blood test (FOBT) of the stool. You may have this test every year starting at age 33.  Flexible sigmoidoscopy or colonoscopy. You may have a sigmoidoscopy every 5 years or a colonoscopy every 10 years starting at age 48.  Hepatitis C blood test.  Hepatitis B blood test.  Sexually transmitted disease (STD) testing.  Diabetes screening. This is done by checking your blood sugar (glucose) after you have not eaten for a while (fasting). You may have this done every 1-3 years.  Bone density scan. This is done to screen for osteoporosis. You may have this done starting at age 32.  Mammogram. This may be done every 1-2 years. Talk to your health care provider about how often you should have regular mammograms. Talk with your health care provider about your test results, treatment options, and if necessary, the need for more tests. Vaccines  Your health care provider may recommend certain vaccines, such as:  Influenza vaccine. This is recommended every year.  Tetanus, diphtheria, and acellular pertussis (Tdap, Td) vaccine. You may need a  Td booster every 10 years.  Zoster vaccine. You may need this after age 35.  Pneumococcal 13-valent conjugate (PCV13) vaccine. One dose is recommended after age 49.  Pneumococcal polysaccharide  (PPSV23) vaccine. One dose is recommended after age 66. Talk to your health care provider about which screenings and vaccines you need and how often you need them. This information is not intended to replace advice given to you by your health care provider. Make sure you discuss any questions you have with your health care provider. Document Released: 08/14/2015 Document Revised: 04/06/2016 Document Reviewed: 05/19/2015 Elsevier Interactive Patient Education  2017 [place] Prevention in the Home Falls can cause injuries. They can happen to people of all ages. There are many things you can do to make your home safe and to help prevent falls. What can I do on the outside of my home?  Regularly fix the edges of walkways and driveways and fix any cracks.  Remove anything that might make you trip as you walk through a door, such as a raised step or threshold.  Trim any bushes or trees on the path to your home.  Use bright outdoor lighting.  Clear any walking paths of anything that might make someone trip, such as rocks or tools.  Regularly check to see if handrails are loose or broken. Make sure that both sides of any steps have handrails.  Any raised decks and porches should have guardrails on the edges.  Have any leaves, snow, or ice cleared regularly.  Use sand or salt on walking paths during winter.  Clean up any spills in your garage right away. This includes oil or grease spills. What can I do in the bathroom?  Use night lights.  Install grab bars by the toilet and in the tub and shower. Do not use towel bars as grab bars.  Use non-skid mats or decals in the tub or shower.  If you need to sit down in the shower, use a plastic, non-slip stool.  Keep the floor dry. Clean up any water that spills on the floor as soon as it happens.  Remove soap buildup in the tub or shower regularly.  Attach bath mats securely with double-sided non-slip rug tape.  Do not have  throw rugs and other things on the floor that can make you trip. What can I do in the bedroom?  Use night lights.  Make sure that you have a light by your bed that is easy to reach.  Do not use any sheets or blankets that are too big for your bed. They should not hang down onto the floor.  Have a firm chair that has side arms. You can use this for support while you get dressed.  Do not have throw rugs and other things on the floor that can make you trip. What can I do in the kitchen?  Clean up any spills right away.  Avoid walking on wet floors.  Keep items that you use a lot in easy-to-reach places.  If you need to reach something above you, use a strong step stool that has a grab bar.  Keep electrical cords out of the way.  Do not use floor polish or wax that makes floors slippery. If you must use wax, use non-skid floor wax.  Do not have throw rugs and other things on the floor that can make you trip. What can I do with my stairs?  Do not leave any items on the stairs.  Make sure that there are handrails on both sides of the stairs and use them. Fix handrails that are broken or loose. Make sure that handrails are as long as the stairways.  Check any carpeting to make sure that it is firmly attached to the stairs. Fix any carpet that is loose or worn.  Avoid having throw rugs at the top or bottom of the stairs. If you do have throw rugs, attach them to the floor with carpet tape.  Make sure that you have a light switch at the top of the stairs and the bottom of the stairs. If you do not have them, ask someone to add them for you. What else can I do to help prevent falls?  Wear shoes that:  Do not have high heels.  Have rubber bottoms.  Are comfortable and fit you well.  Are closed at the toe. Do not wear sandals.  If you use a stepladder:  Make sure that it is fully opened. Do not climb a closed stepladder.  Make sure that both sides of the stepladder are  locked into place.  Ask someone to hold it for you, if possible.  Clearly mark and make sure that you can see:  Any grab bars or handrails.  First and last steps.  Where the edge of each step is.  Use tools that help you move around (mobility aids) if they are needed. These include:  Canes.  Walkers.  Scooters.  Crutches.  Turn on the lights when you go into a dark area. Replace any light bulbs as soon as they burn out.  Set up your furniture so you have a clear path. Avoid moving your furniture around.  If any of your floors are uneven, fix them.  If there are any pets around you, be aware of where they are.  Review your medicines with your doctor. Some medicines can make you feel dizzy. This can increase your chance of falling. Ask your doctor what other things that you can do to help prevent falls. This information is not intended to replace advice given to you by your health care provider. Make sure you discuss any questions you have with your health care provider. Document Released: 05/14/2009 Document Revised: 12/24/2015 Document Reviewed: 08/22/2014 Elsevier Interactive Patient Education  2017 [employer].

## 2020-09-30 ENCOUNTER — Ambulatory Visit: Payer: Self-pay

## 2020-10-10 ENCOUNTER — Encounter: Payer: Self-pay | Admitting: Nurse Practitioner

## 2020-10-14 ENCOUNTER — Other Ambulatory Visit: Payer: Self-pay

## 2020-10-14 ENCOUNTER — Ambulatory Visit (INDEPENDENT_AMBULATORY_CARE_PROVIDER_SITE_OTHER): Payer: Medicare HMO | Admitting: Nurse Practitioner

## 2020-10-14 ENCOUNTER — Encounter: Payer: Self-pay | Admitting: Nurse Practitioner

## 2020-10-14 VITALS — BP 126/79 | HR 66 | Temp 98.3°F | Wt 131.2 lb

## 2020-10-14 DIAGNOSIS — J432 Centrilobular emphysema: Secondary | ICD-10-CM | POA: Diagnosis not present

## 2020-10-14 DIAGNOSIS — I10 Essential (primary) hypertension: Secondary | ICD-10-CM

## 2020-10-14 DIAGNOSIS — I700 Atherosclerosis of aorta: Secondary | ICD-10-CM

## 2020-10-14 DIAGNOSIS — E039 Hypothyroidism, unspecified: Secondary | ICD-10-CM | POA: Diagnosis not present

## 2020-10-14 DIAGNOSIS — F419 Anxiety disorder, unspecified: Secondary | ICD-10-CM

## 2020-10-14 DIAGNOSIS — E782 Mixed hyperlipidemia: Secondary | ICD-10-CM

## 2020-10-14 DIAGNOSIS — R7301 Impaired fasting glucose: Secondary | ICD-10-CM | POA: Diagnosis not present

## 2020-10-14 DIAGNOSIS — E559 Vitamin D deficiency, unspecified: Secondary | ICD-10-CM

## 2020-10-14 DIAGNOSIS — F17219 Nicotine dependence, cigarettes, with unspecified nicotine-induced disorders: Secondary | ICD-10-CM

## 2020-10-14 DIAGNOSIS — R69 Illness, unspecified: Secondary | ICD-10-CM | POA: Diagnosis not present

## 2020-10-14 LAB — MICROALBUMIN, URINE WAIVED
Creatinine, Urine Waived: 200 mg/dL (ref 10–300)
Microalb, Ur Waived: 10 mg/L (ref 0–19)
Microalb/Creat Ratio: <30 mg/g (ref <30)

## 2020-10-14 MED ORDER — LEVOTHYROXINE SODIUM 75 MCG PO TABS: 75.0000 ug | ORAL_TABLET | Freq: Every day | ORAL | 4 refills | Status: DC

## 2020-10-14 MED ORDER — ATORVASTATIN CALCIUM 40 MG PO TABS: 40.0000 mg | ORAL_TABLET | Freq: Every day | ORAL | 4 refills | Status: DC

## 2020-10-14 MED ORDER — METOPROLOL SUCCINATE ER 50 MG PO TB24: ORAL_TABLET | ORAL | 4 refills | Status: DC

## 2020-10-14 MED ORDER — AMLODIPINE BESYLATE 2.5 MG PO TABS: 2.5000 mg | ORAL_TABLET | Freq: Every day | ORAL | 4 refills | Status: DC

## 2020-10-14 MED ORDER — CITALOPRAM HYDROBROMIDE 20 MG PO TABS: 20.0000 mg | ORAL_TABLET | Freq: Every day | ORAL | 4 refills | Status: DC

## 2020-10-14 NOTE — Patient Instructions (Signed)
Managing Loss, Adult People experience loss in many different ways throughout their lives. Events such as moving, changing jobs, and losing friends can create a sense of loss. The loss may be as serious as a major health change, divorce, death of a pet, or death of a loved one. All of these types of loss are likely to create a physical and emotional reaction known as grief. Grief is the result of a major change or an absence of something or someone that you count on. Grief is a normal reaction to loss. A variety of factors can affect your grieving experience, including:  The nature of your loss.  Your relationship to what or whom you lost.  Your understanding of grief and how to manage it.  Your support system. How to manage lifestyle changes Keep to your normal routine as much as possible.  If you have trouble focusing or doing normal activities, it is acceptable to take some time away from your normal routine.  Spend time with friends and loved ones.  Eat a healthy diet, get plenty of sleep, and rest when you feel tired.   How to recognize changes  The way that you deal with your grief will affect your ability to function as you normally do. When grieving, you may experience these changes:  Numbness, shock, sadness, anxiety, anger, denial, and guilt.  Thoughts about death.  Unexpected crying.  A physical sensation of emptiness in your stomach.  Problems sleeping and eating.  Tiredness (fatigue).  Loss of interest in normal activities.  Dreaming about or imagining seeing the person who died.  A need to remember what or whom you lost.  Difficulty thinking about anything other than your loss for a period of time.  Relief. If you have been expecting the loss for a while, you may feel a sense of relief when it happens. Follow these instructions at home: Activity Express your feelings in healthy ways, such as:  Talking with others about your loss. It may be helpful to find  others who have had a similar loss, such as a support group.  Writing down your feelings in a journal.  Doing physical activities to release stress and emotional energy.  Doing creative activities like painting, sculpting, or playing or listening to music.  Practicing resilience. This is the ability to recover and adjust after facing challenges. Reading some resources that encourage resilience may help you to learn ways to practice those behaviors.   General instructions  Be patient with yourself and others. Allow the grieving process to happen, and remember that grieving takes time. ? It is likely that you may never feel completely done with some grief. You may find a way to move on while still cherishing memories and feelings about your loss. ? Accepting your loss is a process. It can take months or longer to adjust.  Keep all follow-up visits as told by your health care provider. This is important. Where to find support To get support for managing loss:  Ask your health care provider for help and recommendations, such as grief counseling or therapy.  Think about joining a support group for people who are managing a loss. Where to find more information You can find more information about managing loss from:  American Society of Clinical Oncology: www.cancer.net  American Psychological Association: www.apa.org Contact a health care provider if:  Your grief is extreme and keeps getting worse.  You have ongoing grief that does not improve.  Your body shows symptoms   of grief, such as illness.  You feel depressed, anxious, or lonely. Get help right away if:  You have thoughts about hurting yourself or others. If you ever feel like you may hurt yourself or others, or have thoughts about taking your own life, get help right away. You can go to your nearest emergency department or call:  Your local emergency services (911 in the [place]  A suicide crisis helpline, such as the  National Suicide Prevention Lifeline at 1-800-273-8255. This is open 24 hours a day. Summary  Grief is the result of a major change or an absence of someone or something that you count on. Grief is a normal reaction to loss.  The depth of grief and the period of recovery depend on the type of loss and your ability to adjust to the change and process your feelings.  Processing grief requires patience and a willingness to accept your feelings and talk about your loss with people who are supportive.  It is important to find resources that work for you and to realize that people experience grief differently. There is not one grieving process that works for everyone in the same way.  Be aware that when grief becomes extreme, it can lead to more severe issues like isolation, depression, anxiety, or suicidal thoughts. Talk with your health care provider if you have any of these issues. This information is not intended to replace advice given to you by your health care provider. Make sure you discuss any questions you have with your health care provider. Document Revised: 01/09/2020 Document Reviewed: 01/09/2020 Elsevier Patient Education  2021 [employer].  

## 2020-10-14 NOTE — Assessment & Plan Note (Signed)
Continue daily statin and recommend complete cessation of smoking.  Recommend daily Baby ASA 81 MG.  If symptoms present will refer to cardiology.

## 2020-10-14 NOTE — Assessment & Plan Note (Signed)
I have recommended complete cessation of tobacco use. I have discussed various options available for assistance with tobacco cessation including over the counter methods (Nicotine gum, patch and lozenges). We also discussed prescription options (Chantix, Nicotine Inhaler / Nasal Spray). The patient is not interested in pursuing any prescription tobacco cessation options at this time.  Continue yearly lung screening.  

## 2020-10-14 NOTE — Progress Notes (Signed)
BP 126/79   Pulse 66   Temp 98.3 F (36.8 C) (Oral)   Wt 131 lb 3.2 oz (59.5 kg)   LMP  (LMP Unknown)   SpO2 99%   BMI 27.90 kg/m    Subjective:    Patient ID: Savannah Whitaker, female    DOB: [birth year], 70 y.o.   MRN: [id number]  HPI: Savannah Whitaker is a 70 y.o. female  Chief Complaint  Patient presents with  . Medication Review    Patient denies having any problems or concerns at today's visit.   VITAMIN D DEFICIENCY: Taking weekly high dose Vitamin D with last level 28.2, improved from previous at 14.3.  Denies any recent falls, fractures, or muscle pain.    HYPERTENSION / HYPERLIPIDEMIA Continues on Atorvastatin, Amlodipine, and Metoprolol.  On recent labs Trigs were 549, non fasting at time.  LDL 80 and TCHOL 199.  On recent CT imaging lung two-vessel coronary atherosclerosis was noted, similar was noted on previous exam in 2018 and 2019.   Satisfied with current treatment? yes Duration of hypertension: chronic BP monitoring frequency: not checking BP range: not checking BP medication side effects: no Duration of hyperlipidemia: chronic Cholesterol medication side effects: no Cholesterol supplements: none Medication compliance: good compliance Aspirin: no Recent stressors: no Recurrent headaches: no Visual changes: no Palpitations: no Dyspnea: no Chest pain: no Lower extremity edema: no Dizzy/lightheaded: no   HYPOTHYROIDISM Continues on Levothyroxine 75 MCG daily. Thyroid control status:stable Satisfied with current treatment? no Medication side effects: no Medication compliance: good compliance Etiology of hypothyroidism:  Recent dose adjustment:no Fatigue: no Cold intolerance: no Heat intolerance: no Weight gain: no Weight loss: no Constipation: no Diarrhea/loose stools: no Palpitations: no Lower extremity edema: no Anxiety/depressed mood: no  COPD No current inhaler regimen. Recent low dose CT performed on 06/05/2020 with benign  appearance -- notes emphysema and aortic atherosclerosis. Currently smokes 1 PPD, has been smoking since she was 24.  COPD status: stable Satisfied with current treatment?: yes Oxygen use: no Dyspnea frequency:  none Cough frequency: none Rescue inhaler frequency:  none Limitation of activity: no Productive cough: none Last Spirometry: 2020 Pneumovax: Not Up to Date Influenza: Up to Date   ANXIETY Continues on Celexa 20 MG daily -- does endorse increased sadness due to husband passing in November 2021 unexpectedly.   Mood status: stable Satisfied with current treatment?: yes Symptom severity: mild  Duration of current treatment : chronic Side effects: no Medication compliance: good compliance Psychotherapy/counseling: none Depressed mood: yes Anxious mood: no Anhedonia: no Significant weight loss or gain: no Insomnia: yes hard to fall asleep Fatigue: no Feelings of worthlessness or guilt: no Impaired concentration/indecisiveness: no Suicidal ideations: no Hopelessness: no Crying spells: yes Depression screen [facility] 2/9 10/14/2020 09/28/2020 09/26/2019 05/29/2019 03/29/2018  Decreased Interest 1 0 0 1 0  Down, Depressed, Hopeless 1 0 0 1 0  PHQ - 2 Score 2 0 0 2 0  Altered sleeping 0 - - 1 0  Tired, decreased energy 1 - - 0 0  Change in appetite 0 - - 0 0  Feeling bad or failure about yourself  0 - - 0 0  Trouble concentrating - - - 0 0  Moving slowly or fidgety/restless 0 - - 0 0  Suicidal thoughts 0 - - 0 0  PHQ-9 Score 3 - - 3 0  Difficult doing work/chores Not difficult at all - - Not difficult at all -    Relevant past medical, surgical, family  and social history reviewed and updated as indicated. Interim medical history since our last visit reviewed. Allergies and medications reviewed and updated.  Review of Systems  Constitutional: Negative for activity change, appetite change, diaphoresis, fatigue and fever.  Respiratory: Negative for cough, chest tightness and  shortness of breath.   Cardiovascular: Negative for chest pain, palpitations and leg swelling.  Gastrointestinal: Negative.   Neurological: Negative.   Psychiatric/Behavioral: Negative.     Per HPI unless specifically indicated above     Objective:    BP 126/79   Pulse 66   Temp 98.3 F (36.8 C) (Oral)   Wt 131 lb 3.2 oz (59.5 kg)   LMP  (LMP Unknown)   SpO2 99%   BMI 27.90 kg/m   Wt Readings from Last 3 Encounters:  10/14/20 131 lb 3.2 oz (59.5 kg)  09/28/20 130 lb (59 kg)  06/05/20 131 lb (59.4 kg)    Physical Exam Vitals and nursing note reviewed.  Constitutional:      General: She is awake. She is not in acute distress.    Appearance: She is well-developed and well-groomed. She is not ill-appearing.  HENT:     Head: Normocephalic.     Right Ear: Hearing normal.     Left Ear: Hearing normal.  Eyes:     General: Lids are normal.        Right eye: No discharge.        Left eye: No discharge.     Conjunctiva/sclera: Conjunctivae normal.     Pupils: Pupils are equal, round, and reactive to light.  Neck:     Vascular: No carotid bruit.  Cardiovascular:     Rate and Rhythm: Normal rate and regular rhythm.     Heart sounds: Normal heart sounds. No murmur heard. No gallop.   Pulmonary:     Effort: Pulmonary effort is normal. No accessory muscle usage or respiratory distress.     Breath sounds: Normal breath sounds.  Abdominal:     General: Bowel sounds are normal.     Palpations: Abdomen is soft.  Musculoskeletal:     Cervical back: Normal range of motion and neck supple.     Right lower leg: No edema.     Left lower leg: No edema.  Skin:    General: Skin is warm and dry.  Neurological:     Mental Status: She is alert and oriented to person, place, and time.  Psychiatric:        Attention and Perception: Attention normal.        Mood and Affect: Mood normal. Affect is tearful.        Speech: Speech normal.        Behavior: Behavior normal. Behavior is  cooperative.    Results for orders placed or performed in visit on 10/11/19  VITAMIN D 25 Hydroxy (Vit-D Deficiency, Fractures)  Result Value Ref Range   Vit D, 25-Hydroxy 41.6 30.0 - 100.0 ng/mL  Lipid Panel w/o Chol/HDL Ratio  Result Value Ref Range   Cholesterol, Total 199 100 - 199 mg/dL   Triglycerides 967 (H) 0 - 149 mg/dL   HDL 31 (L) >89 mg/dL   VLDL Cholesterol Cal 88 (H) 5 - 40 mg/dL   LDL Chol Calc (NIH) 80 0 - 99 mg/dL      Assessment & Plan:   Problem List Items Addressed This Visit      Cardiovascular and Mediastinum   Hypertension    Chronic, ongoing with BP  below goal today. Continue Metoprolol and Amlodipine and adjust as needed.  Recommend she monitor BP at least a few mornings a week at home and document.  DASH diet at home.  Labs today: CMP, urine ALB, TSH.  Return in 6 months for physical.       Relevant Medications   amLODipine (NORVASC) 2.5 MG tablet   atorvastatin (LIPITOR) 40 MG tablet   metoprolol succinate (TOPROL-XL) 50 MG 24 hr tablet   Other Relevant Orders   Comprehensive metabolic panel   Microalbumin, Urine Waived   Aortic atherosclerosis (HCC)    Continue daily statin and recommend complete cessation of smoking.  Recommend daily Baby ASA 81 MG.  If symptoms present will refer to cardiology.      Relevant Medications   amLODipine (NORVASC) 2.5 MG tablet   atorvastatin (LIPITOR) 40 MG tablet   metoprolol succinate (TOPROL-XL) 50 MG 24 hr tablet     Respiratory   Centrilobular emphysema (HCC) - Primary    Chronic, ongoing with no inhalers.  Last FEV1/FVC 110% and FEV1 86%. Recommend complete cessation of smoking and continue yearly CT screening.  Plan on monitoring lung function annually and add inhalers as needed.      Relevant Orders   CBC with Differential/Platelet     Endocrine   Hypothyroidism    Chronic, ongoing.  Continue current medication regimen and adjust as needed.  Thyroid labs today.  Refills sent in.      Relevant  Medications   levothyroxine (SYNTHROID) 75 MCG tablet   metoprolol succinate (TOPROL-XL) 50 MG 24 hr tablet   Other Relevant Orders   TSH   T4, free   IFG (impaired fasting glucose)    Stable on recent labs with glucose 88.  Check CMP today.        Nervous and Auditory   Nicotine dependence, cigarettes, w unsp disorders    I have recommended complete cessation of tobacco use. I have discussed various options available for assistance with tobacco cessation including over the counter methods (Nicotine gum, patch and lozenges). We also discussed prescription options (Chantix, Nicotine Inhaler / Nasal Spray). The patient is not interested in pursuing any prescription tobacco cessation options at this time.  Continue yearly lung screening.         Other   Hyperlipidemia    Chronic, ongoing.  Continue Atorvastatin 40 MG and recheck lipid today (fasting).  Adjust as needed. Recent triglycerides elevated, was not fasting.  If continue elevation consider addition of Fenofibrate to regimen.      Relevant Medications   amLODipine (NORVASC) 2.5 MG tablet   atorvastatin (LIPITOR) 40 MG tablet   metoprolol succinate (TOPROL-XL) 50 MG 24 hr tablet   Other Relevant Orders   Comprehensive metabolic panel   Lipid Panel w/o Chol/HDL Ratio   Vitamin D deficiency    Ongoing, no current supplement, did not buy OTC, recheck Vit D today.  Recommend change to daily Vitamin D 3 1000 units.      Relevant Orders   VITAMIN D 25 Hydroxy (Vit-D Deficiency, Fractures)   Chronic anxiety    Chronic, stable. Denies SI/HI.  Continue current medication regimen and adjust as needed.  Refills sent.        Relevant Medications   citalopram (CELEXA) 20 MG tablet       Follow up plan: Return in about 8 months (around 05/31/2021) for Annual physical.

## 2020-10-14 NOTE — Assessment & Plan Note (Signed)
Chronic, ongoing with BP below goal today. Continue Metoprolol and Amlodipine and adjust as needed.  Recommend she monitor BP at least a few mornings a week at home and document.  DASH diet at home.  Labs today: CMP, urine ALB, TSH.  Return in 6 months for physical.

## 2020-10-14 NOTE — Assessment & Plan Note (Signed)
Ongoing, no current supplement, did not buy OTC, recheck Vit D today.  Recommend change to daily Vitamin D 3 1000 units.

## 2020-10-14 NOTE — Assessment & Plan Note (Signed)
Chronic, stable. Denies SI/HI.  Continue current medication regimen and adjust as needed.  Refills sent. 

## 2020-10-14 NOTE — Assessment & Plan Note (Signed)
Stable on recent labs with glucose 88.  Check CMP today.

## 2020-10-14 NOTE — Assessment & Plan Note (Signed)
Chronic, ongoing.  Continue Atorvastatin 40 MG and recheck lipid today (fasting).  Adjust as needed. Recent triglycerides elevated, was not fasting.  If continue elevation consider addition of Fenofibrate to regimen.

## 2020-10-14 NOTE — Assessment & Plan Note (Signed)
Chronic, ongoing with no inhalers.  Last FEV1/FVC 110% and FEV1 86%. Recommend complete cessation of smoking and continue yearly CT screening.  Plan on monitoring lung function annually and add inhalers as needed.

## 2020-10-14 NOTE — Assessment & Plan Note (Signed)
Chronic, ongoing.  Continue current medication regimen and adjust as needed.  Thyroid labs today.  Refills sent in.

## 2020-10-15 LAB — CBC WITH DIFFERENTIAL/PLATELET
Basophils Absolute: 0.1 10*3/uL (ref 0.0–0.2)
Basos: 1 %
EOS (ABSOLUTE): 0.4 10*3/uL (ref 0.0–0.4)
Eos: 5 %
Hematocrit: 40.5 % (ref 34.0–46.6)
Hemoglobin: 13.6 g/dL (ref 11.1–15.9)
Immature Grans (Abs): 0 10*3/uL (ref 0.0–0.1)
Immature Granulocytes: 0 %
Lymphocytes Absolute: 2.3 10*3/uL (ref 0.7–3.1)
Lymphs: 29 %
MCH: 30 pg (ref 26.6–33.0)
MCHC: 33.6 g/dL (ref 31.5–35.7)
MCV: 89 fL (ref 79–97)
Monocytes Absolute: 0.5 10*3/uL (ref 0.1–0.9)
Monocytes: 6 %
Neutrophils Absolute: 4.8 10*3/uL (ref 1.4–7.0)
Neutrophils: 59 %
Platelets: 261 10*3/uL (ref 150–450)
RBC: 4.53 x10E6/uL (ref 3.77–5.28)
RDW: 13.2 % (ref 11.7–15.4)
WBC: 8 10*3/uL (ref 3.4–10.8)

## 2020-10-15 LAB — COMPREHENSIVE METABOLIC PANEL
ALT: 13 IU/L (ref 0–32)
AST: 16 IU/L (ref 0–40)
Albumin/Globulin Ratio: 1.4 (ref 1.2–2.2)
Albumin: 4.2 g/dL (ref 3.8–4.8)
Alkaline Phosphatase: 71 IU/L (ref 44–121)
BUN/Creatinine Ratio: 23 (ref 12–28)
BUN: 19 mg/dL (ref 8–27)
Bilirubin Total: 0.4 mg/dL (ref 0.0–1.2)
CO2: 20 mmol/L (ref 20–29)
Calcium: 9.3 mg/dL (ref 8.7–10.3)
Chloride: 105 mmol/L (ref 96–106)
Creatinine, Ser: 0.82 mg/dL (ref 0.57–1.00)
Globulin, Total: 2.9 g/dL (ref 1.5–4.5)
Glucose: 105 mg/dL — ABNORMAL HIGH (ref 65–99)
Potassium: 4.6 mmol/L (ref 3.5–5.2)
Sodium: 140 mmol/L (ref 134–144)
Total Protein: 7.1 g/dL (ref 6.0–8.5)
eGFR: 77 mL/min/{1.73_m2} (ref >59)

## 2020-10-15 LAB — LIPID PANEL W/O CHOL/HDL RATIO
Cholesterol, Total: 184 mg/dL (ref 100–199)
HDL: 38 mg/dL — ABNORMAL LOW (ref >39)
LDL Chol Calc (NIH): 95 mg/dL (ref 0–99)
Triglycerides: 302 mg/dL — ABNORMAL HIGH (ref 0–149)
VLDL Cholesterol Cal: 51 mg/dL — ABNORMAL HIGH (ref 5–40)

## 2020-10-15 LAB — VITAMIN D 25 HYDROXY (VIT D DEFICIENCY, FRACTURES): Vit D, 25-Hydroxy: 22.4 ng/mL — ABNORMAL LOW (ref 30.0–100.0)

## 2020-10-15 LAB — T4, FREE: Free T4: 1.07 ng/dL (ref 0.82–1.77)

## 2020-10-15 LAB — TSH: TSH: 0.919 u[IU]/mL (ref 0.450–4.500)

## 2020-10-15 NOTE — Progress Notes (Signed)
Good afternoon, please let Savannah Whitaker know her labs have returned.  Kidney and liver function remains stable.  Cholesterol levels showed some mild elevation in triglycerides, I would recommend taking some fish oil daily and continue Atorvastatin, if ongoing elevation next visit I may increase Atorvastatin dosing.  Thyroid labs normal, continue current Levothyroxine.  Vitamin D remains on lower side, I recommend increase your daily Vitamin D3 to 2 tablets (2000 units) and we will recheck next visit.  Any questions? Keep being awesome!!  Thank you for allowing me to participate in your care. Kindest regards, Graeden Bitner

## 2021-05-24 ENCOUNTER — Other Ambulatory Visit: Payer: Self-pay

## 2021-05-24 ENCOUNTER — Ambulatory Visit (INDEPENDENT_AMBULATORY_CARE_PROVIDER_SITE_OTHER): Payer: Medicare HMO | Admitting: Nurse Practitioner

## 2021-05-24 ENCOUNTER — Encounter: Payer: Self-pay | Admitting: Nurse Practitioner

## 2021-05-24 VITALS — BP 111/70 | HR 69 | Temp 98.3°F | Ht <= 58 in | Wt 130.2 lb

## 2021-05-24 DIAGNOSIS — Z0000 Encounter for general adult medical examination without abnormal findings: Secondary | ICD-10-CM

## 2021-05-24 DIAGNOSIS — E039 Hypothyroidism, unspecified: Secondary | ICD-10-CM | POA: Diagnosis not present

## 2021-05-24 DIAGNOSIS — I10 Essential (primary) hypertension: Secondary | ICD-10-CM | POA: Diagnosis not present

## 2021-05-24 DIAGNOSIS — I700 Atherosclerosis of aorta: Secondary | ICD-10-CM

## 2021-05-24 DIAGNOSIS — J432 Centrilobular emphysema: Secondary | ICD-10-CM | POA: Diagnosis not present

## 2021-05-24 DIAGNOSIS — E782 Mixed hyperlipidemia: Secondary | ICD-10-CM | POA: Diagnosis not present

## 2021-05-24 DIAGNOSIS — R7301 Impaired fasting glucose: Secondary | ICD-10-CM | POA: Diagnosis not present

## 2021-05-24 DIAGNOSIS — Z23 Encounter for immunization: Secondary | ICD-10-CM

## 2021-05-24 DIAGNOSIS — R69 Illness, unspecified: Secondary | ICD-10-CM | POA: Diagnosis not present

## 2021-05-24 DIAGNOSIS — E559 Vitamin D deficiency, unspecified: Secondary | ICD-10-CM | POA: Diagnosis not present

## 2021-05-24 DIAGNOSIS — F419 Anxiety disorder, unspecified: Secondary | ICD-10-CM

## 2021-05-24 DIAGNOSIS — F17219 Nicotine dependence, cigarettes, with unspecified nicotine-induced disorders: Secondary | ICD-10-CM

## 2021-05-24 NOTE — Assessment & Plan Note (Signed)
Chronic, ongoing with no inhalers.  Last FEV1/FVC 110% and FEV1 86% in 2020. Recommend complete cessation of smoking and continue yearly CT screening.  Plan on monitoring lung function annually and add inhalers as needed.  Spirometry next visit.  CBC today.

## 2021-05-24 NOTE — Assessment & Plan Note (Signed)
Check CMP today 

## 2021-05-24 NOTE — Assessment & Plan Note (Signed)
Chronic, ongoing with BP below goal today. Continue Metoprolol and Amlodipine and adjust as needed.  Recommend she monitor BP at least a few mornings a week at home and document.  DASH diet at home.  Labs today: CMP, CBC, TSH.  Return in 6 months for follow-up.

## 2021-05-24 NOTE — Assessment & Plan Note (Addendum)
Chronic, stable. Denies SI/HI.  Continue current medication regimen and adjust as needed.  Refills sent.  Recommend hospice support group for grieving.

## 2021-05-24 NOTE — Progress Notes (Signed)
BP 111/70   Pulse 69   Temp 98.3 F (36.8 C) (Oral)   Ht 4' 9"  (1.448 m)   Wt 130 lb 3.2 oz (59.1 kg)   LMP  (LMP Unknown)   SpO2 96%   BMI 28.18 kg/m    Subjective:    Patient ID: Savannah Whitaker, female    DOB: [birth year], 70 y.o.   MRN: [id number]  HPI: Gari Trovato is a 70 y.o. female presenting on 05/24/2021 for comprehensive medical examination. Current medical complaints include:none  She currently lives with: self Menopausal Symptoms: no   COPD No current inhaler regimen.  CT screening last in November 06/05/20.  Currently smokes 1 PPD, has been smoking since she was 24.  Not interested in quitting.   COPD status: stable Satisfied with current treatment?: yes Oxygen use: no Dyspnea frequency: none Cough frequency: chronic, at baseline Rescue inhaler frequency:  none Limitation of activity: no Productive cough: none Last Spirometry: November 2020 Pneumovax:  refused Influenza: Up to Date  HYPERTENSION / HYPERLIPIDEMIA Continues on Atorvastatin, Amlodipine, and Metoprolol. CT imaging noted lung two-vessel coronary atherosclerosis was noted, similar was noted on previous exam in 2018 and 2019.  Satisfied with current treatment? yes Duration of hypertension: chronic BP monitoring frequency: rarely BP range: 150-160/80's when she checks at home, but only checks when having symptoms BP medication side effects: no Duration of hyperlipidemia: chronic Cholesterol medication side effects: no Cholesterol supplements: none Medication compliance: good compliance Aspirin: no Recent stressors: no Recurrent headaches: no Visual changes: no Palpitations: no Dyspnea: no Chest pain: no Lower extremity edema: no Dizzy/lightheaded: no   HYPOTHYROIDISM Continues on Levothyroxine 75 MCG daily. Thyroid control status:stable Satisfied with current treatment? yes Medication side effects: no Medication compliance: good compliance Etiology of hypothyroidism:   Recent dose adjustment:no Fatigue: no Cold intolerance: no Heat intolerance: no Weight gain: no Weight loss: no Constipation: no Diarrhea/loose stools: no Palpitations: no Lower extremity edema: no Anxiety/depressed mood: no   VITAMIN D DEFICIENCY: Noted on problem list, no recent level. No supplements.  No recent falls or fractures.  Declines DEXA screening and mammogram screening.  ANXIETY/STRESS Continues on Citalopram 20 MG daily. Her husband passed one year ago, occasionally feels down. Duration: stable Anxious mood: yes  Excessive worrying: no Irritability: no  Sweating: no Nausea: no Palpitations:no Hyperventilation: no Panic attacks: no Agoraphobia: no  Obscessions/compulsions: no Depressed mood: no Depression screen [facility] 2/9 05/24/2021 10/14/2020 09/28/2020 09/26/2019 05/29/2019  Decreased Interest 2 1 0 0 1  Down, Depressed, Hopeless 1 1 0 0 1  PHQ - 2 Score 3 2 0 0 2  Altered sleeping 1 0 - - 1  Tired, decreased energy 1 1 - - 0  Change in appetite 0 0 - - 0  Feeling bad or failure about yourself  0 0 - - 0  Trouble concentrating 0 - - - 0  Moving slowly or fidgety/restless 0 0 - - 0  Suicidal thoughts 0 0 - - 0  PHQ-9 Score 5 3 - - 3  Difficult doing work/chores Somewhat difficult Not difficult at all - - Not difficult at all  Some recent data might be hidden   Anhedonia: no Weight changes: no Insomnia: no  none   Hypersomnia: no Fatigue/loss of energy: no Feelings of worthlessness: no Feelings of guilt: no Impaired concentration/indecisiveness: no Suicidal ideations: no  Crying spells: no Recent Stressors/Life Changes: no   Relationship problems: no   Family stress: no     Financial  stress: no    Job stress: no    Recent death/loss: no  Fall Risk  2021-05-28 09/28/2020 09/26/2019 05/29/2019 03/29/2018  Falls in the past year? 0 1 0 0 No  Comment - tripped over curb - - -  Number falls in past yr: 0 1 0 0 -  Injury with Fall? 0 0 0 0 -  Risk  for fall due to : No Fall Risks Medication side effect - - -  Follow up Falls prevention discussed Falls evaluation completed;Education provided;Falls prevention discussed - Falls evaluation completed -    Functional Status Survey: Is the patient deaf or have difficulty hearing?: No Does the patient have difficulty seeing, even when wearing glasses/contacts?: No Does the patient have difficulty concentrating, remembering, or making decisions?: No Does the patient have difficulty walking or climbing stairs?: No Does the patient have difficulty dressing or bathing?: No Does the patient have difficulty doing errands alone such as visiting a doctor's office or shopping?: No    Past Medical History:  Past Medical History:  Diagnosis Date   Anxiety    Hyperlipidemia    Hypertension    Hypertriglyceridemia    IFG (impaired fasting glucose)    Osteoarthritis    Thyroid disease    Tobacco use disorder    Vitamin D deficiency     Surgical History:  Past Surgical History:  Procedure Laterality Date   CARPAL TUNNEL RELEASE Right    TUBAL LIGATION      Medications:  Current Outpatient Medications on File Prior to Visit  Medication Sig   amLODipine (NORVASC) 2.5 MG tablet Take 1 tablet (2.5 mg total) by mouth daily.   atorvastatin (LIPITOR) 40 MG tablet Take 1 tablet (40 mg total) by mouth daily at 6 PM.   Cholecalciferol 25 MCG (1000 UT) tablet Take 1 tablet (1,000 Units total) by mouth daily.   citalopram (CELEXA) 20 MG tablet Take 1 tablet (20 mg total) by mouth daily.   levothyroxine (SYNTHROID) 75 MCG tablet Take 1 tablet (75 mcg total) by mouth daily.   metoprolol succinate (TOPROL-XL) 50 MG 24 hr tablet Take with or immediately following a meal.   nystatin cream (MYCOSTATIN) Apply 1 application topically 2 (two) times daily.   No current facility-administered medications on file prior to visit.    Allergies:  No Known Allergies  Social History:  Social History    Socioeconomic History   Marital status: Widowed    Spouse name: Not on file   Number of children: Not on file   Years of education: Not on file   Highest education level: High school graduate  Occupational History   Occupation: semi retired  Tobacco Use   Smoking status: Every Day    Packs/day: 1.00    Years: 40.00    Pack years: 40.00    Types: Cigarettes   Smokeless tobacco: Never  Vaping Use   Vaping Use: Never used  Substance and Sexual Activity   Alcohol use: Yes    Alcohol/week: 0.0 standard drinks    Comment: occasional wine.    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    Social Determinants of Health   Financial Resource Strain: Low Risk    Difficulty of Paying Living Expenses: Not hard at all  Food Insecurity: No Food Insecurity  Worried About Charity fundraiser in the Last Year: Never true   [place] in the Last Year: Never true  Transportation Needs: No Transportation Needs   Lack of Transportation (Medical): No   Lack of Transportation (Non-Medical): No  Physical Activity: Inactive   Days of Exercise per Week: 0 days   Minutes of Exercise per Session: 0 min  Stress: No Stress Concern Present   Feeling of Stress : Not at all  Social Connections: Not on file  Intimate Partner Violence: Not on file   Social History   Tobacco Use  Smoking Status Every Day   Packs/day: 1.00   Years: 40.00   Pack years: 40.00   Types: Cigarettes  Smokeless Tobacco Never   Social History   Substance and Sexual Activity  Alcohol Use Yes   Alcohol/week: 0.0 standard drinks   Comment: occasional wine.     Family History:  Family History  Problem Relation Age of Onset   Lung cancer Mother    Heart disease Father    Colon cancer Brother    Alcohol abuse Brother    Cancer Brother        mouth   Breast cancer Neg Hx      Past medical history, surgical history, medications, allergies, family history and social history reviewed with patient today and changes made to appropriate areas of the chart.   Review of Systems - negative All other ROS negative except what is listed above and in the HPI.      Objective:    BP 111/70   Pulse 69   Temp 98.3 F (36.8 C) (Oral)   Ht 4' 9"  (1.448 m)   Wt 130 lb 3.2 oz (59.1 kg)   LMP  (LMP Unknown)   SpO2 96%   BMI 28.18 kg/m   Wt Readings from Last 3 Encounters:  05/24/21 130 lb 3.2 oz (59.1 kg)  10/14/20 131 lb 3.2 oz (59.5 kg)  09/28/20 130 lb (59 kg)    Physical Exam Constitutional:      General: She is awake. She is not in acute distress.    Appearance: She is well-developed. She is not ill-appearing.  HENT:     Head: Normocephalic and atraumatic.     Right Ear: Hearing, tympanic membrane, ear canal and external ear normal. No drainage.     Left Ear: Hearing, tympanic membrane, ear canal and external ear normal. No drainage.     Nose: Nose normal.     Right Sinus: No maxillary sinus tenderness or frontal sinus tenderness.     Left Sinus: No maxillary sinus tenderness or frontal sinus tenderness.     Mouth/Throat:     Mouth: Mucous membranes are moist.     Pharynx: Oropharynx is clear. Uvula midline. No pharyngeal swelling, oropharyngeal exudate or posterior oropharyngeal erythema.  Eyes:     General: Lids are normal.        Right eye: No discharge.        Left eye: No discharge.     Extraocular Movements: Extraocular movements intact.     Conjunctiva/sclera: Conjunctivae normal.     Pupils: Pupils are equal, round, and reactive to light.     Visual Fields: Right eye visual fields normal and left eye visual fields normal.  Neck:     Thyroid: No thyromegaly.     Vascular: No carotid bruit.     Trachea: Trachea normal.  Cardiovascular:     Rate and Rhythm: Normal  rate and regular rhythm.     Heart sounds: Normal heart sounds. No murmur  heard.   No gallop.  Pulmonary:     Effort: Pulmonary effort is normal. No accessory muscle usage or respiratory distress.     Breath sounds: Normal breath sounds.  Chest:  Breasts:    Right: Normal. No swelling, bleeding, inverted nipple, mass, nipple discharge, skin change or tenderness.     Left: Normal. No swelling, bleeding, inverted nipple, mass, nipple discharge, skin change or tenderness.  Abdominal:     General: Bowel sounds are normal.     Palpations: Abdomen is soft. There is no hepatomegaly or splenomegaly.     Tenderness: There is no abdominal tenderness.  Musculoskeletal:        General: Normal range of motion.     Cervical back: Normal range of motion and neck supple.     Right lower leg: No edema.     Left lower leg: No edema.  Lymphadenopathy:     Head:     Right side of head: No submental, submandibular, tonsillar, preauricular or posterior auricular adenopathy.     Left side of head: No submental, submandibular, tonsillar, preauricular or posterior auricular adenopathy.     Cervical: No cervical adenopathy.     Upper Body:     Right upper body: No supraclavicular, axillary or pectoral adenopathy.     Left upper body: No supraclavicular, axillary or pectoral adenopathy.  Skin:    General: Skin is warm and dry.     Capillary Refill: Capillary refill takes less than 2 seconds.     Findings: No rash.  Neurological:     Mental Status: She is alert and oriented to person, place, and time.     Gait: Gait is intact.     Deep Tendon Reflexes: Reflexes are normal and symmetric.     Reflex Scores:      Brachioradialis reflexes are 2+ on the right side and 2+ on the left side.      Patellar reflexes are 2+ on the right side and 2+ on the left side. Psychiatric:        Attention and Perception: Attention normal.        Mood and Affect: Mood normal.        Speech: Speech normal.        Behavior: Behavior normal. Behavior is cooperative.        Thought Content: Thought  content normal.        Judgment: Judgment normal.   Results for orders placed or performed in visit on 10/14/20  CBC with Differential/Platelet  Result Value Ref Range   WBC 8.0 3.4 - 10.8 x10E3/uL   RBC 4.53 3.77 - 5.28 x10E6/uL   Hemoglobin 13.6 11.1 - 15.9 g/dL   Hematocrit 40.5 34.0 - 46.6 %   MCV 89 79 - 97 fL   MCH 30.0 26.6 - 33.0 pg   MCHC 33.6 31.5 - 35.7 g/dL   RDW 13.2 11.7 - 15.4 %   Platelets 261 150 - 450 x10E3/uL   Neutrophils 59 Not Estab. %   Lymphs 29 Not Estab. %   Monocytes 6 Not Estab. %   Eos 5 Not Estab. %   Basos 1 Not Estab. %   Neutrophils Absolute 4.8 1.4 - 7.0 x10E3/uL   Lymphocytes Absolute 2.3 0.7 - 3.1 x10E3/uL   Monocytes Absolute 0.5 0.1 - 0.9 x10E3/uL   EOS (ABSOLUTE) 0.4 0.0 - 0.4 x10E3/uL   Basophils Absolute  0.1 0.0 - 0.2 x10E3/uL   Immature Granulocytes 0 Not Estab. %   Immature Grans (Abs) 0.0 0.0 - 0.1 x10E3/uL  Comprehensive metabolic panel  Result Value Ref Range   Glucose 105 (H) 65 - 99 mg/dL   BUN 19 8 - 27 mg/dL   Creatinine, Ser 0.82 0.57 - 1.00 mg/dL   eGFR 77 >59 mL/min/1.73   BUN/Creatinine Ratio 23 12 - 28   Sodium 140 134 - 144 mmol/L   Potassium 4.6 3.5 - 5.2 mmol/L   Chloride 105 96 - 106 mmol/L   CO2 20 20 - 29 mmol/L   Calcium 9.3 8.7 - 10.3 mg/dL   Total Protein 7.1 6.0 - 8.5 g/dL   Albumin 4.2 3.8 - 4.8 g/dL   Globulin, Total 2.9 1.5 - 4.5 g/dL   Albumin/Globulin Ratio 1.4 1.2 - 2.2   Bilirubin Total 0.4 0.0 - 1.2 mg/dL   Alkaline Phosphatase 71 44 - 121 IU/L   AST 16 0 - 40 IU/L   ALT 13 0 - 32 IU/L  Lipid Panel w/o Chol/HDL Ratio  Result Value Ref Range   Cholesterol, Total 184 100 - 199 mg/dL   Triglycerides 302 (H) 0 - 149 mg/dL   HDL 38 (L) >39 mg/dL   VLDL Cholesterol Cal 51 (H) 5 - 40 mg/dL   LDL Chol Calc (NIH) 95 0 - 99 mg/dL  TSH  Result Value Ref Range   TSH 0.919 0.450 - 4.500 uIU/mL  VITAMIN D 25 Hydroxy (Vit-D Deficiency, Fractures)  Result Value Ref Range   Vit D, 25-Hydroxy 22.4 (L)  30.0 - 100.0 ng/mL  Microalbumin, Urine Waived  Result Value Ref Range   Microalb, Ur Waived 10 0 - 19 mg/L   Creatinine, Urine Waived 200 10 - 300 mg/dL   Microalb/Creat Ratio <30 <30 mg/g  T4, free  Result Value Ref Range   Free T4 1.07 0.82 - 1.77 ng/dL      Assessment & Plan:   Problem List Items Addressed This Visit       Cardiovascular and Mediastinum   Hypertension    Chronic, ongoing with BP below goal today. Continue Metoprolol and Amlodipine and adjust as needed.  Recommend she monitor BP at least a few mornings a week at home and document.  DASH diet at home.  Labs today: CMP, CBC, TSH.  Return in 6 months for follow-up.       Relevant Orders   CBC with Differential/Platelet   Comprehensive metabolic panel   Aortic atherosclerosis (HCC)    Continue daily statin and recommend complete cessation of smoking.  Recommend daily Baby ASA 81 MG.  If symptoms present will refer to cardiology.        Respiratory   Centrilobular emphysema (HCC) - Primary    Chronic, ongoing with no inhalers.  Last FEV1/FVC 110% and FEV1 86% in 2020. Recommend complete cessation of smoking and continue yearly CT screening.  Plan on monitoring lung function annually and add inhalers as needed.  Spirometry next visit.  CBC today.        Endocrine   Hypothyroidism    Chronic, ongoing.  Continue current medication regimen and adjust as needed.  Thyroid labs today. Refills sent in.      Relevant Orders   TSH   T4, free   IFG (impaired fasting glucose)    Check CMP today.      Relevant Orders   Comprehensive metabolic panel     Nervous and Auditory  Nicotine dependence, cigarettes, w unsp disorders    I have recommended complete cessation of tobacco use. I have discussed various options available for assistance with tobacco cessation including over the counter methods (Nicotine gum, patch and lozenges). We also discussed prescription options (Chantix, Nicotine Inhaler / Nasal Spray).  The patient is not interested in pursuing any prescription tobacco cessation options at this time.          Other   Hyperlipidemia    Chronic, ongoing.  Continue Atorvastatin 40 MG and recheck lipid today (fasting).  Adjust as needed. Recent triglycerides elevated, was not fasting.  If continue elevation consider addition of Fenofibrate to regimen.      Relevant Orders   Comprehensive metabolic panel   Lipid Panel w/o Chol/HDL Ratio   Vitamin D deficiency    Ongoing, taking supplement, recheck Vit D today.  Recommend change to daily Vitamin D 3 2000 units.      Relevant Orders   VITAMIN D 25 Hydroxy (Vit-D Deficiency, Fractures)   Thyroid peroxidase antibody   Chronic anxiety    Chronic, stable. Denies SI/HI.  Continue current medication regimen and adjust as needed.  Refills sent.  Recommend hospice support group for grieving.      Other Visit Diagnoses     Flu vaccine need       Flu vaccine provided today.   Relevant Orders   Flu Vaccine QUAD High Dose(Fluad)   Encounter for annual physical exam       Annual physical with labs today.  Health maintenance reviewed, refuses mammogram, DEXA, and colonoscopy.        Follow up plan: Return in about 6 months (around 11/22/2021) for COPD, HTN/HLD, MOOD -- spirometry needed.   LABORATORY TESTING:  - Pap smear: not applicable  IMMUNIZATIONS:   - Tdap: Tetanus vaccination status reviewed: refuses - Influenza: Up to date - Pneumovax: Refused - Prevnar: Refused - HPV: Not applicable - Zostavax vaccine: Refused  SCREENING: -Mammogram: Refused  - Colonoscopy: Refused  - Bone Density: Refused  -Hearing Test: Not applicable  -Spirometry: Not applicable   PATIENT COUNSELING:   Advised to take 1 mg of folate supplement per day if capable of pregnancy.   Sexuality: Discussed sexually transmitted diseases, partner selection, use of condoms, avoidance of unintended pregnancy  and contraceptive alternatives.   Advised to  avoid cigarette smoking.  I discussed with the patient that most people either abstain from alcohol or drink within safe limits (<=14/week and <=4 drinks/occasion for males, <=7/weeks and <= 3 drinks/occasion for females) and that the risk for alcohol disorders and other health effects rises proportionally with the number of drinks per week and how often a drinker exceeds daily limits.  Discussed cessation/primary prevention of drug use and availability of treatment for abuse.   Diet: Encouraged to adjust caloric intake to maintain  or achieve ideal body weight, to reduce intake of dietary saturated fat and total fat, to limit sodium intake by avoiding high sodium foods and not adding table salt, and to maintain adequate dietary potassium and calcium preferably from fresh fruits, vegetables, and low-fat dairy products.    Stressed the importance of regular exercise  Injury prevention: Discussed safety belts, safety helmets, smoke detector, smoking near bedding or upholstery.   Dental health: Discussed importance of regular tooth brushing, flossing, and dental visits.    NEXT PREVENTATIVE PHYSICAL DUE IN 1 YEAR. Return in about 6 months (around 11/22/2021) for COPD, HTN/HLD, MOOD -- spirometry needed.

## 2021-05-24 NOTE — Patient Instructions (Signed)

## 2021-05-24 NOTE — Assessment & Plan Note (Signed)
Continue daily statin and recommend complete cessation of smoking.  Recommend daily Baby ASA 81 MG.  If symptoms present will refer to cardiology. 

## 2021-05-24 NOTE — Assessment & Plan Note (Signed)
Chronic, ongoing.  Continue Atorvastatin 40 MG and recheck lipid today (fasting).  Adjust as needed. Recent triglycerides elevated, was not fasting.  If continue elevation consider addition of Fenofibrate to regimen. 

## 2021-05-24 NOTE — Assessment & Plan Note (Signed)
Chronic, ongoing.  Continue current medication regimen and adjust as needed.  Thyroid labs today.  Refills sent in. 

## 2021-05-24 NOTE — Assessment & Plan Note (Addendum)
Ongoing, taking supplement, recheck Vit D today.  Recommend change to daily Vitamin D 3 2000 units. 

## 2021-05-24 NOTE — Assessment & Plan Note (Addendum)
I have recommended complete cessation of tobacco use. I have discussed various options available for assistance with tobacco cessation including over the counter methods (Nicotine gum, patch and lozenges). We also discussed prescription options (Chantix, Nicotine Inhaler / Nasal Spray). The patient is not interested in pursuing any prescription tobacco cessation options at this time.  

## 2021-05-25 ENCOUNTER — Other Ambulatory Visit: Payer: Self-pay | Admitting: *Deleted

## 2021-05-25 ENCOUNTER — Encounter: Payer: Self-pay | Admitting: Nurse Practitioner

## 2021-05-25 DIAGNOSIS — F17210 Nicotine dependence, cigarettes, uncomplicated: Secondary | ICD-10-CM

## 2021-05-25 DIAGNOSIS — Z87891 Personal history of nicotine dependence: Secondary | ICD-10-CM

## 2021-05-25 LAB — COMPREHENSIVE METABOLIC PANEL
ALT: 12 IU/L (ref 0–32)
AST: 13 IU/L (ref 0–40)
Albumin/Globulin Ratio: 1.8 (ref 1.2–2.2)
Albumin: 4.3 g/dL (ref 3.8–4.8)
Alkaline Phosphatase: 70 IU/L (ref 44–121)
BUN/Creatinine Ratio: 24 (ref 12–28)
BUN: 18 mg/dL (ref 8–27)
Bilirubin Total: 0.3 mg/dL (ref 0.0–1.2)
CO2: 21 mmol/L (ref 20–29)
Calcium: 8.9 mg/dL (ref 8.7–10.3)
Chloride: 103 mmol/L (ref 96–106)
Creatinine, Ser: 0.74 mg/dL (ref 0.57–1.00)
Globulin, Total: 2.4 g/dL (ref 1.5–4.5)
Glucose: 75 mg/dL (ref 70–99)
Potassium: 3.9 mmol/L (ref 3.5–5.2)
Sodium: 139 mmol/L (ref 134–144)
Total Protein: 6.7 g/dL (ref 6.0–8.5)
eGFR: 88 mL/min/{1.73_m2} (ref >59)

## 2021-05-25 LAB — CBC WITH DIFFERENTIAL/PLATELET
Basophils Absolute: 0.1 10*3/uL (ref 0.0–0.2)
Basos: 1 %
EOS (ABSOLUTE): 0.2 10*3/uL (ref 0.0–0.4)
Eos: 3 %
Hematocrit: 35.3 % (ref 34.0–46.6)
Hemoglobin: 12.4 g/dL (ref 11.1–15.9)
Immature Grans (Abs): 0 10*3/uL (ref 0.0–0.1)
Immature Granulocytes: 0 %
Lymphocytes Absolute: 2.6 10*3/uL (ref 0.7–3.1)
Lymphs: 29 %
MCH: 31 pg (ref 26.6–33.0)
MCHC: 35.1 g/dL (ref 31.5–35.7)
MCV: 88 fL (ref 79–97)
Monocytes Absolute: 0.6 10*3/uL (ref 0.1–0.9)
Monocytes: 7 %
Neutrophils Absolute: 5.4 10*3/uL (ref 1.4–7.0)
Neutrophils: 60 %
Platelets: 249 10*3/uL (ref 150–450)
RBC: 4 x10E6/uL (ref 3.77–5.28)
RDW: 13.9 % (ref 11.7–15.4)
WBC: 8.9 10*3/uL (ref 3.4–10.8)

## 2021-05-25 LAB — LIPID PANEL W/O CHOL/HDL RATIO
Cholesterol, Total: 168 mg/dL (ref 100–199)
HDL: 31 mg/dL — ABNORMAL LOW (ref >39)
LDL Chol Calc (NIH): 64 mg/dL (ref 0–99)
Triglycerides: 475 mg/dL — ABNORMAL HIGH (ref 0–149)
VLDL Cholesterol Cal: 73 mg/dL — ABNORMAL HIGH (ref 5–40)

## 2021-05-25 LAB — VITAMIN D 25 HYDROXY (VIT D DEFICIENCY, FRACTURES): Vit D, 25-Hydroxy: 20 ng/mL — ABNORMAL LOW (ref 30.0–100.0)

## 2021-05-25 LAB — T4, FREE: Free T4: 0.97 ng/dL (ref 0.82–1.77)

## 2021-05-25 LAB — THYROID PEROXIDASE ANTIBODY: Thyroperoxidase Ab SerPl-aCnc: 286 IU/mL — ABNORMAL HIGH (ref 0–34)

## 2021-05-25 LAB — TSH: TSH: 0.53 u[IU]/mL (ref 0.450–4.500)

## 2021-05-25 NOTE — Progress Notes (Signed)
Good morning, please let Savannah Whitaker know her labs have returned.  Overall they are reassuring and you can continue all medications as ordered.  Your Vitamin D level is low, please ensure you are taking Vitamin D3 2000 units daily and we will recheck next visit.  I also recommend you schedule your bone density scan.  Any questions? Keep being awesome!!  Thank you for allowing me to participate in your care.  I appreciate you. Kindest regards, Alantis Bethune

## 2021-06-01 ENCOUNTER — Encounter: Payer: Medicare HMO | Admitting: Nurse Practitioner

## 2021-06-18 ENCOUNTER — Other Ambulatory Visit: Payer: Self-pay

## 2021-06-18 ENCOUNTER — Ambulatory Visit
Admission: RE | Admit: 2021-06-18 | Discharge: 2021-06-18 | Disposition: A | Discharge status: Home / Self Care | Payer: Medicare HMO | Source: Ambulatory Visit | Attending: Acute Care | Admitting: Acute Care

## 2021-06-18 DIAGNOSIS — R69 Illness, unspecified: Secondary | ICD-10-CM | POA: Diagnosis not present

## 2021-06-18 DIAGNOSIS — Z87891 Personal history of nicotine dependence: Secondary | ICD-10-CM | POA: Diagnosis not present

## 2021-06-18 DIAGNOSIS — F17210 Nicotine dependence, cigarettes, uncomplicated: Secondary | ICD-10-CM | POA: Insufficient documentation

## 2021-06-21 ENCOUNTER — Other Ambulatory Visit: Payer: Self-pay | Admitting: Acute Care

## 2021-06-21 DIAGNOSIS — F17200 Nicotine dependence, unspecified, uncomplicated: Secondary | ICD-10-CM

## 2021-06-21 DIAGNOSIS — Z87891 Personal history of nicotine dependence: Secondary | ICD-10-CM

## 2021-06-21 DIAGNOSIS — F17210 Nicotine dependence, cigarettes, uncomplicated: Secondary | ICD-10-CM

## 2021-09-30 ENCOUNTER — Ambulatory Visit (INDEPENDENT_AMBULATORY_CARE_PROVIDER_SITE_OTHER): Payer: Medicare HMO | Admitting: *Deleted

## 2021-09-30 DIAGNOSIS — Z0000 Encounter for general adult medical examination without abnormal findings: Secondary | ICD-10-CM

## 2021-09-30 NOTE — Patient Instructions (Signed)
Savannah Whitaker , Thank you for taking time to come for your Medicare Wellness Visit. I appreciate your ongoing commitment to your health goals. Please review the following plan we discussed and let me know if I can assist you in the future.   Screening recommendations/referrals: Colonoscopy: declined Mammogram: declined Bone Density: declined Recommended yearly ophthalmology/optometry visit for glaucoma screening and checkup Recommended yearly dental visit for hygiene and checkup  Vaccinations: Influenza vaccine: up to date Pneumococcal vaccine: Education provided Tdap vaccine: Education provided Shingles vaccine: Education provided    Advanced directives: Education provided  Conditions/risks identified:   Next appointment: -11-22-2021 @ 8:00 Cannady-   Preventive Care 65 Years and Older, Female Preventive care refers to lifestyle choices and visits with your health care provider that can promote health and wellness. What does preventive care include? A yearly physical exam. This is also called an annual well check. Dental exams once or twice a year. Routine eye exams. Ask your health care provider how often you should have your eyes checked. Personal lifestyle choices, including: Daily care of your teeth and gums. Regular physical activity. Eating a healthy diet. Avoiding tobacco and drug use. Limiting alcohol use. Practicing safe sex. Taking low-dose aspirin every day. Taking vitamin and mineral supplements as recommended by your health care provider. What happens during an annual well check? The services and screenings done by your health care provider during your annual well check will depend on your age, overall health, lifestyle risk factors, and family history of disease. Counseling  Your health care provider may ask you questions about your: Alcohol use. Tobacco use. Drug use. Emotional well-being. Home and relationship well-being. Sexual activity. Eating  habits. History of falls. Memory and ability to understand (cognition). Work and work Astronomer. Reproductive health. Screening  You may have the following tests or measurements: Height, weight, and BMI. Blood pressure. Lipid and cholesterol levels. These may be checked every 5 years, or more frequently if you are over 65 years old. Skin check. Lung cancer screening. You may have this screening every year starting at age 16 if you have a 30-pack-year history of smoking and currently smoke or have quit within the past 15 years. Fecal occult blood test (FOBT) of the stool. You may have this test every year starting at age 26. Flexible sigmoidoscopy or colonoscopy. You may have a sigmoidoscopy every 5 years or a colonoscopy every 10 years starting at age 63. Hepatitis C blood test. Hepatitis B blood test. Sexually transmitted disease (STD) testing. Diabetes screening. This is done by checking your blood sugar (glucose) after you have not eaten for a while (fasting). You may have this done every 1-3 years. Bone density scan. This is done to screen for osteoporosis. You may have this done starting at age 24. Mammogram. This may be done every 1-2 years. Talk to your health care provider about how often you should have regular mammograms. Talk with your health care provider about your test results, treatment options, and if necessary, the need for more tests. Vaccines  Your health care provider may recommend certain vaccines, such as: Influenza vaccine. This is recommended every year. Tetanus, diphtheria, and acellular pertussis (Tdap, Td) vaccine. You may need a Td booster every 10 years. Zoster vaccine. You may need this after age 10. Pneumococcal 13-valent conjugate (PCV13) vaccine. One dose is recommended after age 51. Pneumococcal polysaccharide (PPSV23) vaccine. One dose is recommended after age 59. Talk to your health care provider about which screenings and vaccines you need and  how  often you need them. This information is not intended to replace advice given to you by your health care provider. Make sure you discuss any questions you have with your health care provider. Document Released: 08/14/2015 Document Revised: 04/06/2016 Document Reviewed: 05/19/2015 Elsevier Interactive Patient Education  2017 [place] Prevention in the Home Falls can cause injuries. They can happen to people of all ages. There are many things you can do to make your home safe and to help prevent falls. What can I do on the outside of my home? Regularly fix the edges of walkways and driveways and fix any cracks. Remove anything that might make you trip as you walk through a door, such as a raised step or threshold. Trim any bushes or trees on the path to your home. Use bright outdoor lighting. Clear any walking paths of anything that might make someone trip, such as rocks or tools. Regularly check to see if handrails are loose or broken. Make sure that both sides of any steps have handrails. Any raised decks and porches should have guardrails on the edges. Have any leaves, snow, or ice cleared regularly. Use sand or salt on walking paths during winter. Clean up any spills in your garage right away. This includes oil or grease spills. What can I do in the bathroom? Use night lights. Install grab bars by the toilet and in the tub and shower. Do not use towel bars as grab bars. Use non-skid mats or decals in the tub or shower. If you need to sit down in the shower, use a plastic, non-slip stool. Keep the floor dry. Clean up any water that spills on the floor as soon as it happens. Remove soap buildup in the tub or shower regularly. Attach bath mats securely with double-sided non-slip rug tape. Do not have throw rugs and other things on the floor that can make you trip. What can I do in the bedroom? Use night lights. Make sure that you have a light by your bed that is easy to  reach. Do not use any sheets or blankets that are too big for your bed. They should not hang down onto the floor. Have a firm chair that has side arms. You can use this for support while you get dressed. Do not have throw rugs and other things on the floor that can make you trip. What can I do in the kitchen? Clean up any spills right away. Avoid walking on wet floors. Keep items that you use a lot in easy-to-reach places. If you need to reach something above you, use a strong step stool that has a grab bar. Keep electrical cords out of the way. Do not use floor polish or wax that makes floors slippery. If you must use wax, use non-skid floor wax. Do not have throw rugs and other things on the floor that can make you trip. What can I do with my stairs? Do not leave any items on the stairs. Make sure that there are handrails on both sides of the stairs and use them. Fix handrails that are broken or loose. Make sure that handrails are as long as the stairways. Check any carpeting to make sure that it is firmly attached to the stairs. Fix any carpet that is loose or worn. Avoid having throw rugs at the top or bottom of the stairs. If you do have throw rugs, attach them to the floor with carpet tape. Make sure that you have  a light switch at the top of the stairs and the bottom of the stairs. If you do not have them, ask someone to add them for you. What else can I do to help prevent falls? Wear shoes that: Do not have high heels. Have rubber bottoms. Are comfortable and fit you well. Are closed at the toe. Do not wear sandals. If you use a stepladder: Make sure that it is fully opened. Do not climb a closed stepladder. Make sure that both sides of the stepladder are locked into place. Ask someone to hold it for you, if possible. Clearly mark and make sure that you can see: Any grab bars or handrails. First and last steps. Where the edge of each step is. Use tools that help you move  around (mobility aids) if they are needed. These include: Canes. Walkers. Scooters. Crutches. Turn on the lights when you go into a dark area. Replace any light bulbs as soon as they burn out. Set up your furniture so you have a clear path. Avoid moving your furniture around. If any of your floors are uneven, fix them. If there are any pets around you, be aware of where they are. Review your medicines with your doctor. Some medicines can make you feel dizzy. This can increase your chance of falling. Ask your doctor what other things that you can do to help prevent falls. This information is not intended to replace advice given to you by your health care provider. Make sure you discuss any questions you have with your health care provider. Document Released: 05/14/2009 Document Revised: 12/24/2015 Document Reviewed: 08/22/2014 Elsevier Interactive Patient Education  2017 [employer].

## 2021-09-30 NOTE — Progress Notes (Signed)
Subjective:   Savannah Whitaker is a 71 y.o. female who presents for Medicare Annual (Subsequent) preventive examination.  I connected with  Savannah Whitaker on 09/30/21 by a video enabled telemedicine application and verified that I am speaking with the correct person using two identifiers.   I discussed the limitations of evaluation and management by telemedicine. The patient expressed understanding and agreed to proceed.  Patient location: home  Provider location:     Review of Systems     Cardiac Risk Factors include: advanced age (>76men, >63 women);hypertension;smoking/ tobacco exposure     Objective:    Today's Vitals   There is no height or weight on file to calculate BMI.  Advanced Directives 09/30/2021 09/28/2020 09/26/2019 03/29/2018 07/08/2015  Does Patient Have a Medical Advance Directive? No No No No No  Would patient like information on creating a medical advance directive? No - Patient declined - - No - Patient declined Yes - Educational materials given    Current Medications (verified) Outpatient Encounter Medications as of 09/30/2021  Medication Sig   amLODipine (NORVASC) 2.5 MG tablet Take 1 tablet (2.5 mg total) by mouth daily.   atorvastatin (LIPITOR) 40 MG tablet Take 1 tablet (40 mg total) by mouth daily at 6 PM.   Biotin 1 MG CAPS Take by mouth.   Cholecalciferol 25 MCG (1000 UT) tablet Take 1 tablet (1,000 Units total) by mouth daily.   citalopram (CELEXA) 20 MG tablet Take 1 tablet (20 mg total) by mouth daily.   levothyroxine (SYNTHROID) 75 MCG tablet Take 1 tablet (75 mcg total) by mouth daily.   metoprolol succinate (TOPROL-XL) 50 MG 24 hr tablet Take with or immediately following a meal.   nystatin cream (MYCOSTATIN) Apply 1 application topically 2 (two) times daily.   No facility-administered encounter medications on file as of 09/30/2021.    Allergies (verified) Patient has no known allergies.   History: Past Medical History:   Diagnosis Date   Anxiety    Hyperlipidemia    Hypertension    Hypertriglyceridemia    IFG (impaired fasting glucose)    Osteoarthritis    Thyroid disease    Tobacco use disorder    Vitamin D deficiency    Past Surgical History:  Procedure Laterality Date   CARPAL TUNNEL RELEASE Right    TUBAL LIGATION     Family History  Problem Relation Age of Onset   Lung cancer Mother    Heart disease Father    Colon cancer Brother    Alcohol abuse Brother    Cancer Brother        mouth   Breast cancer Neg Hx    Social History   Socioeconomic History   Marital status: Widowed    Spouse name: Not on file   Number of children: Not on file   Years of education: Not on file   Highest education level: High school graduate  Occupational History   Occupation: semi retired  Tobacco Use   Smoking status: Every Day    Packs/day: 1.00    Years: 40.00    Pack years: 40.00    Types: Cigarettes   Smokeless tobacco: Never  Vaping Use   Vaping Use: Never used  Substance and Sexual Activity   Alcohol use: Yes    Alcohol/week: 0.0 standard drinks    Comment: occasional wine.    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    Social Determinants of Health   Financial Resource Strain: Low Risk    Difficulty of Paying Living Expenses: Not hard at all  Food Insecurity: No Food Insecurity   Worried About Charity fundraiser in the Last Year: Never true   [place] in the Last Year: Never true  Transportation Needs: No Transportation Needs   Lack of Transportation (Medical): No   Lack of Transportation (Non-Medical): No  Physical Activity: Inactive   Days of Exercise per Week: 0 days   Minutes of Exercise per Session: 0 min  Stress: No Stress Concern Present   Feeling of Stress : Not at all  Social Connections: Moderately Isolated   Frequency of  Communication with Friends and Family: More than three times a week   Frequency of Social Gatherings with Friends and Family: Once a week   Attends Religious Services: More than 4 times per year   Active Member of [employer] or Organizations: No   Attends Archivist Meetings: Never   Marital Status: Widowed    Tobacco Counseling Ready to quit: Not Answered Counseling given: Not Answered   Clinical Intake:  Pre-visit preparation completed: Yes  Pain : No/denies pain     Nutritional Risks: None Diabetes: No  How often do you need to have someone help you when you read instructions, pamphlets, or other written materials from your doctor or pharmacy?: 1 - Never  Diabetic?  no  Interpreter Needed?: No  Information entered by :: Leroy Kennedy LPN   Activities of Daily Living In your present state of health, do you have any difficulty performing the following activities: 09/30/2021 05/24/2021  Hearing? N N  Vision? N N  Difficulty concentrating or making decisions? N N  Walking or climbing stairs? N N  Dressing or bathing? N N  Doing errands, shopping? N N  Preparing Food and eating ? N -  Using the Toilet? N -  In the past six months, have you accidently leaked urine? N -  Do you have problems with loss of bowel control? N -  Managing your Medications? N -  Managing your Finances? N -  Housekeeping or managing your Housekeeping? N -  Some recent data might be hidden    Patient Care Team: Venita Lick, NP as PCP - General (Nurse Practitioner) Minna Merritts, MD as Consulting Physician (Cardiology)  Indicate any recent Medical Services you may have received from other than [facility] providers in the past year (date may be approximate).     Assessment:   This is a routine wellness examination for Savannah Whitaker.  Hearing/Vision screen Hearing Screening - Comments:: No trouble hearing Vision Screening - Comments:: Not up to date   Dietary issues and exercise  activities discussed: Current Exercise Habits: The patient does not participate in regular exercise at present, Intensity: Not Applicable, Exercise limited by: None identified   Goals Addressed             This Visit's Progress    Weight (lb) < 200 lb (90.7 kg)       Quit smoking       Depression Screen PHQ 2/9 Scores 09/30/2021 05/24/2021 10/14/2020 09/28/2020 09/26/2019 05/29/2019 03/29/2018  PHQ - 2 Score 1 3 2  0 0 2 0  PHQ- 9 Score 2 5 3  - - 3 0    Fall Risk Fall Risk  09/30/2021 05/24/2021 09/28/2020 09/26/2019 05/29/2019  Falls in the past year? 0 0 1 0 0  Comment - - tripped over curb - -  Number falls in past yr: 0 0 1 0 0  Injury with Fall? 0 0 0 0 0  Risk for fall due to : - No Fall Risks Medication side effect - -  Follow up Falls evaluation completed;Falls prevention discussed Falls prevention discussed Falls evaluation completed;Education provided;Falls prevention discussed - Falls evaluation completed    FALL RISK PREVENTION PERTAINING TO THE HOME:  Any stairs in or around the home? No  If so, are there any without handrails? No  Home free of loose throw rugs in walkways, pet beds, electrical cords, etc? Yes  Adequate lighting in your home to reduce risk of falls? Yes   ASSISTIVE DEVICES UTILIZED TO PREVENT FALLS:  Life alert? No  Use of a cane, walker or w/c? No  Grab bars in the bathroom? No  Shower chair or bench in shower? No  Elevated toilet seat or a handicapped toilet? No   TIMED UP AND GO:  Was the test performed? No .    Cognitive Function:   Normal cognitive status assessed by direct observation by this Nurse Health Advisor. No abnormalities found.     6CIT Screen 09/28/2020 03/29/2018  What Year? 0 points 0 points  What month? 0 points 0 points  What time? 0 points 0 points  Count back from 20 0 points 0 points  Months in reverse 0 points 0 points  Repeat phrase 2 points 0 points  Total Score 2 0    Immunizations Immunization History   Administered Date(s) Administered   Fluad Quad(high Dose 65+) 05/29/2019, 05/24/2021   Influenza, High Dose Seasonal PF 07/13/2017, 06/01/2018   Influenza,inj,quad, With Preservative 06/19/2020   Td 06/10/2005    TDAP status: Due, Education has been provided regarding the importance of this vaccine. Advised may receive this vaccine at local pharmacy or Health Dept. Aware to provide a copy of the vaccination record if obtained from local pharmacy or Health Dept. Verbalized acceptance and understanding.  Flu Vaccine status: Up to date  Pneumococcal vaccine status: Declined,  Education has been provided regarding the importance of this vaccine but patient still declined. Advised may receive this vaccine at local pharmacy or Health Dept. Aware to provide a copy of the vaccination record if obtained from local pharmacy or Health Dept. Verbalized acceptance and understanding.   Covid-19 vaccine status: Declined, Education has been provided regarding the importance of this vaccine but patient still declined. Advised may receive this vaccine at local pharmacy or Health Dept.or vaccine clinic. Aware to provide a copy of the vaccination record if obtained from local pharmacy or Health Dept. Verbalized acceptance and understanding.  Qualifies for Shingles Vaccine? Yes   Zostavax completed No   Shingrix Completed?: No.    Education has been provided regarding the importance of this vaccine. Patient has been advised to call insurance company to determine out of pocket expense if they have not yet received this vaccine. Advised may also receive vaccine at local pharmacy or Health Dept. Verbalized acceptance and understanding.  Screening Tests Health Maintenance  Topic Date Due   Zoster Vaccines- Shingrix (1 of 2) Never done   MAMMOGRAM  10/14/2021 (Originally 07/23/2001)   DEXA SCAN  10/14/2021 (Originally 07/23/2016)   COLONOSCOPY (Pts 45-95yrs Insurance coverage will need to be confirmed)  10/14/2021  (Originally 07/23/1996)   TETANUS/TDAP  10/14/2021 (Originally 06/11/2015)  Pneumonia Vaccine 67+ Years old (1 - PCV) 05/24/2022 (Originally 07/23/1957)   INFLUENZA VACCINE  Completed   Hepatitis C Screening  Completed   HPV VACCINES  Aged Out   COVID-19 Vaccine  Discontinued    Health Maintenance  Health Maintenance Due  Topic Date Due   Zoster Vaccines- Shingrix (1 of 2) Never done    Colonoscopy declined  Mammogram declined  Bone Density declined  Lung Cancer Screening: (Low Dose CT Chest recommended if Age 53-80 years, 30 pack-year currently smoking OR have quit w/in 15years.) does qualify.   Lung Cancer Screening Referral:   Additional Screening:  Hepatitis C Screening: does not qualify; Completed Hep C  Vision Screening: Recommended annual ophthalmology exams for early detection of glaucoma and other disorders of the eye. Is the patient up to date with their annual eye exam?  No  Who is the provider or what is the name of the office in which the patient attends annual eye exams?  If pt is not established with a provider, would they like to be referred to a provider to establish care? No .   Dental Screening: Recommended annual dental exams for proper oral hygiene  Community Resource Referral / Chronic Care Management: CRR required this visit?  No   CCM required this visit?  No      Plan:     I have personally reviewed and noted the following in the patients chart:   Medical and social history Use of alcohol, tobacco or illicit drugs  Current medications and supplements including opioid prescriptions.  Functional ability and status Nutritional status Physical activity Advanced directives List of other physicians Hospitalizations, surgeries, and ER visits in previous 12 months Vitals Screenings to include cognitive, depression, and falls Referrals and appointments  In addition, I have reviewed and discussed with patient certain preventive protocols,  quality metrics, and best practice recommendations. A written personalized care plan for preventive services as well as general preventive health recommendations were provided to patient.     Leroy Kennedy, LPN   [phone]   Nurse Notes:

## 2021-10-01 ENCOUNTER — Ambulatory Visit: Payer: Medicare HMO

## 2021-11-01 ENCOUNTER — Other Ambulatory Visit: Payer: Self-pay | Admitting: Nurse Practitioner

## 2021-11-02 NOTE — Telephone Encounter (Signed)
Requested Prescriptions  ?Pending Prescriptions Disp Refills  ?? citalopram (CELEXA) 20 MG tablet [Pharmacy Med Name: CITALOPRAM HBR 20 MG TABLET] 90 tablet 0  ?  Sig: TAKE 1 TABLET BY MOUTH EVERY DAY  ?  ? Psychiatry:  Antidepressants - SSRI Passed - 11/01/2021  1:56 AM  ?  ?  Passed - Valid encounter within last 6 months  ?  Recent Outpatient Visits   ?      ? 5 months ago Centrilobular emphysema (HCC)  ? [facility] [place], [place] T, NP  ? 1 year ago Centrilobular emphysema (HCC)  ? [facility], Corrie Dandy T, NP  ? 2 years ago Essential hypertension  ? [facility] [place], Ruma T, NP  ? 2 years ago Pulmonary emphysema, unspecified emphysema type (HCC)  ? [facility] [place], Corrie Dandy T, NP  ? 2 years ago Annual physical exam  ? [facility] Marjie Skiff, NP  ?  ?  ?Future Appointments   ?        ? In 2 weeks Cann[place], Dorie Rank, NP [facility], [facility]  ?  ? ?  ?  ?  ? ?

## 2021-11-19 ENCOUNTER — Other Ambulatory Visit: Payer: Self-pay | Admitting: Nurse Practitioner

## 2021-11-19 NOTE — Telephone Encounter (Signed)
Requested Prescriptions  ?Pending Prescriptions Disp Refills  ?? levothyroxine (SYNTHROID) 75 MCG tablet [Pharmacy Med Name: LEVOTHYROXINE 75 MCG TABLET] 90 tablet 4  ?  Sig: TAKE 1 TABLET BY MOUTH EVERY DAY  ?  ? Endocrinology:  Hypothyroid Agents Passed - 11/19/2021  8:43 AM  ?  ?  Passed - TSH in normal range and within 360 days  ?  TSH  ?Date Value Ref Range Status  ?05/24/2021 0.530 0.450 - 4.500 uIU/mL Final  ?   ?  ?  Passed - Valid encounter within last 12 months  ?  Recent Outpatient Visits   ?      ? 5 months ago Centrilobular emphysema ([place])  ? [place], [place] T, NP  ? 1 year ago Centrilobular emphysema ([place])  ? [place], Henrine Screws T, NP  ? 2 years ago Essential hypertension  ? [place], Deary T, NP  ? 2 years ago Pulmonary emphysema, unspecified emphysema type ([place])  ? [facility] [place], Henrine Screws T, NP  ? 2 years ago Annual physical exam  ? [facility] Venita Lick, NP  ?  ?  ?Future Appointments   ?        ? In 3 days Venita Lick, NP [employer] [employer], [facility]  ?  ? ?  ?  ?  ? ?

## 2021-11-20 NOTE — Patient Instructions (Signed)
COPD and Physical Activity ?Chronic obstructive pulmonary disease (COPD) is a long-term, or chronic, condition that affects the lungs. COPD is a general term that can be used to describe many problems that cause inflammation of the lungs and limit airflow. These conditions include chronic bronchitis and emphysema. ?The main symptom of COPD is shortness of breath, which makes it harder to do even simple tasks. This can also make it harder to exercise and stay active. Talk with your health care provider about treatments to help you breathe better and actions you can take to prevent breathing problems during physical activity. ?What are the benefits of exercising when you have COPD? ?Exercising regularly is an important part of a healthy lifestyle. You can still exercise and do physical activities even though you have COPD. Exercise and physical activity improve your shortness of breath by increasing blood flow (circulation). This causes your heart to pump more oxygen through your body. Moderate exercise can: ?Improve oxygen use. ?Increase your energy level. ?Help with shortness of breath. ?Strengthen your breathing muscles. ?Improve heart health. ?Help with sleep. ?Improve your self-esteem and feelings of self-worth. ?Lower depression, stress, and anxiety. ?Exercise can benefit everyone with COPD. The severity of your disease may affect how hard you can exercise, especially at first, but everyone can benefit. Talk with your health care provider about how much exercise is safe for you, and which activities and exercises are safe for you. ?What actions can I take to prevent breathing problems during physical activity? ?Sign up for a pulmonary rehabilitation program. This type of program may include: ?Education about lung diseases. ?Exercise classes that teach you how to exercise and be more active while improving your breathing. This usually involves: ?Exercise using your lower extremities, such as a stationary  bicycle. ?About 30 minutes of exercise, 2 to 5 times per week, for 6 to 12 weeks. ?Strength training, such as push-ups or leg lifts. ?Nutrition education. ?Group classes in which you can talk with others who also have COPD and learn ways to manage stress. ?If you use an oxygen tank, you should use it while you exercise. Work with your health care provider to adjust your oxygen for your physical activity. Your resting flow rate is different from your flow rate during physical activity. ?How to manage your breathing while exercising ?While you are exercising: ?Take slow breaths. ?Pace yourself, and do nottry to go too fast. ?Purse your lips while breathing out. Pursing your lips is similar to a kissing or whistling position. ?If doing exercise that uses a quick burst of effort, such as weight lifting: ?Breathe in before starting the exercise. ?Breathe out during the hardest part of the exercise, such as raising the weights. ?Where to find support ?You can find support for exercising with COPD from: ?Your health care provider. ?A pulmonary rehabilitation program. ?Your local health department or community health programs. ?Support groups, either online or in-person. Your health care provider may be able to recommend support groups. ?Where to find more information ?You can find more information about exercising with COPD from: ?American Lung Association: lung.org ?COPD Foundation: copdfoundation.org ?Contact a health care provider if: ?Your symptoms get worse. ?You have nausea. ?You have a fever. ?You want to start a new exercise program or a new activity. ?Get help right away if: ?You have chest pain. ?You cannot breathe. ?These symptoms may represent a serious problem that is an emergency. Do not wait to see if the symptoms will go away. Get medical help right away. Call   your local emergency services (911 in the [place] Do not drive yourself to the hospital. ?Summary ?COPD is a general term that can be used to describe  many different lung problems that cause lung inflammation and limit airflow. This includes chronic bronchitis and emphysema. ?Exercise and physical activity improve your shortness of breath by increasing blood flow (circulation). This causes your heart to provide more oxygen to your body. ?Contact your health care provider before starting any exercise program or new activity. Ask your health care provider what exercises and activities are safe for you. ?This information is not intended to replace advice given to you by your health care provider. Make sure you discuss any questions you have with your health care provider. ?Document Revised: 05/26/2020 Document Reviewed: 05/26/2020 ?Elsevier Patient Education ? 2023 [employer] ? ?

## 2021-11-21 ENCOUNTER — Other Ambulatory Visit: Payer: Self-pay | Admitting: Nurse Practitioner

## 2021-11-22 ENCOUNTER — Encounter: Payer: Self-pay | Admitting: Nurse Practitioner

## 2021-11-22 ENCOUNTER — Ambulatory Visit (INDEPENDENT_AMBULATORY_CARE_PROVIDER_SITE_OTHER): Payer: Medicare HMO | Admitting: Nurse Practitioner

## 2021-11-22 VITALS — BP 124/79 | HR 69 | Temp 98.5°F | Ht <= 58 in | Wt 133.0 lb

## 2021-11-22 DIAGNOSIS — R69 Illness, unspecified: Secondary | ICD-10-CM | POA: Diagnosis not present

## 2021-11-22 DIAGNOSIS — I10 Essential (primary) hypertension: Secondary | ICD-10-CM | POA: Diagnosis not present

## 2021-11-22 DIAGNOSIS — E559 Vitamin D deficiency, unspecified: Secondary | ICD-10-CM

## 2021-11-22 DIAGNOSIS — F419 Anxiety disorder, unspecified: Secondary | ICD-10-CM

## 2021-11-22 DIAGNOSIS — J432 Centrilobular emphysema: Secondary | ICD-10-CM

## 2021-11-22 DIAGNOSIS — E063 Autoimmune thyroiditis: Secondary | ICD-10-CM

## 2021-11-22 DIAGNOSIS — E782 Mixed hyperlipidemia: Secondary | ICD-10-CM

## 2021-11-22 DIAGNOSIS — I700 Atherosclerosis of aorta: Secondary | ICD-10-CM

## 2021-11-22 DIAGNOSIS — F17219 Nicotine dependence, cigarettes, with unspecified nicotine-induced disorders: Secondary | ICD-10-CM

## 2021-11-22 MED ORDER — CITALOPRAM HYDROBROMIDE 20 MG PO TABS
20.0000 mg | ORAL_TABLET | Freq: Every day | ORAL | 4 refills | Status: DC
Start: 2021-11-22 — End: 2022-12-05

## 2021-11-22 MED ORDER — BUSPIRONE HCL 10 MG PO TABS: 10.0000 mg | ORAL_TABLET | Freq: Every day | ORAL | 4 refills | Status: DC

## 2021-11-22 MED ORDER — METOPROLOL SUCCINATE ER 50 MG PO TB24: ORAL_TABLET | ORAL | 4 refills | Status: DC

## 2021-11-22 MED ORDER — AMLODIPINE BESYLATE 2.5 MG PO TABS: 2.5000 mg | ORAL_TABLET | Freq: Every day | ORAL | 4 refills | Status: DC

## 2021-11-22 MED ORDER — LEVOTHYROXINE SODIUM 75 MCG PO TABS: 75.0000 ug | ORAL_TABLET | Freq: Every day | ORAL | 4 refills | Status: DC

## 2021-11-22 MED ORDER — ATORVASTATIN CALCIUM 40 MG PO TABS
40.0000 mg | ORAL_TABLET | Freq: Every day | ORAL | 4 refills | Status: DC
Start: 2021-11-22 — End: 2022-05-26

## 2021-11-22 NOTE — Assessment & Plan Note (Signed)
Chronic, ongoing with BP below goal today. Continue Metoprolol and Amlodipine and adjust as needed.  Recommend she monitor BP at least a few mornings a week at home and document.  DASH diet at home.  Labs today: BMP and lipid.  Return in 6 months. ? ?

## 2021-11-22 NOTE — Assessment & Plan Note (Signed)
Ongoing, taking supplement, recheck Vit D today.  Recommend change to daily Vitamin D 3 2000 units. 

## 2021-11-22 NOTE — Telephone Encounter (Signed)
Refilled 11/22/2021 #90 4 refills. ?Requested Prescriptions  ?Pending Prescriptions Disp Refills  ?? metoprolol succinate (TOPROL-XL) 50 MG 24 hr tablet [Pharmacy Med Name: METOPROLOL SUCC ER 50 MG TAB] 90 tablet 4  ?  Sig: TAKE 1 TABLET DAILY WITH OR IMMEDIATELY FOLLOWING A MEAL.  ?  ? Cardiovascular:  Beta Blockers Passed - 11/21/2021 10:55 AM  ?  ?  Passed - Last BP in normal range  ?  BP Readings from Last 1 Encounters:  ?11/22/21 124/79  ?   ?  ?  Passed - Last Heart Rate in normal range  ?  Pulse Readings from Last 1 Encounters:  ?11/22/21 69  ?   ?  ?  Passed - Valid encounter within last 6 months  ?  Recent Outpatient Visits   ?      ? Today Centrilobular emphysema ([place])  ? [place], [place]uttgart T, NP  ? 6 months ago Centrilobular emphysema ([place])  ? [place], [place]yer[place] T, NP  ? 1 year ago Centrilobular emphysema ([place])  ? [place], Henrine Screws T, NP  ? 2 years ago Essential hypertension  ? [place], Orestes T, NP  ? 2 years ago Pulmonary emphysema, unspecified emphysema type ([place])  ? [facility], Henrine Screws T, NP  ?  ?  ?Future Appointments   ?        ? In 6 months [place], Barbaraann Faster, NP [employer]  ?  ? ?  ?  ?  ? ?

## 2021-11-22 NOTE — Assessment & Plan Note (Signed)
I have recommended complete cessation of tobacco use. I have discussed various options available for assistance with tobacco cessation including over the counter methods (Nicotine gum, patch and lozenges). We also discussed prescription options (Chantix, Nicotine Inhaler / Nasal Spray). The patient is not interested in pursuing any prescription tobacco cessation options at this time.  

## 2021-11-22 NOTE — Assessment & Plan Note (Signed)
Continue daily statin and recommend complete cessation of smoking.  Recommend daily Baby ASA 81 MG.  If symptoms present will refer to cardiology. 

## 2021-11-22 NOTE — Assessment & Plan Note (Signed)
Chronic, ongoing with no inhalers.  Last FEV1/FVC 110% and FEV1 86% in 2020 -- unable to repeat today due to supplies in office. Recommend complete cessation of smoking and continue yearly CT screening.  Plan on monitoring lung function annually and add inhalers as needed.  Spirometry next visit.  CBC at physical. ?

## 2021-11-22 NOTE — Assessment & Plan Note (Signed)
Chronic, ongoing.  Continue Atorvastatin 40 MG and recheck lipid today (fasting).  Adjust as needed. Triglycerides in past elevated, was not fasting.  If continue elevation consider addition of Fenofibrate to regimen. 

## 2021-11-22 NOTE — Progress Notes (Addendum)
? ?BP 124/79   Pulse 69   Temp 98.5 ?F (36.9 ?C) (Oral)   Ht 4' 9.75" (1.467 m)   Wt 133 lb (60.3 kg)   LMP  (LMP Unknown)   SpO2 95%   BMI 28.04 kg/m?   ? ?Subjective:  ? ? Patient ID: Savannah Whitaker, female    DOB: [birth year], 71 y.o.   MRN: [id number] ? ?HPI: ?Savannah Whitaker is a 71 y.o. female ? ?Chief Complaint  ?Patient presents with  ? COPD  ? Hyperlipidemia  ? Hypertension  ? Mood  ? ?HYPERTENSION / HYPERLIPIDEMIA ?Continues on Atorvastatin, Amlodipine, and Metoprolol.  On CT imaging lung two-vessel coronary atherosclerosis was noted, similar was noted on previous exams. ?Satisfied with current treatment? yes ?Duration of hypertension: chronic ?BP monitoring frequency: not checking ?BP range: not checking ?BP medication side effects: no ?Duration of hyperlipidemia: chronic ?Cholesterol medication side effects: no ?Cholesterol supplements: none ?Medication compliance: good compliance ?Aspirin: no ?Recent stressors: no ?Recurrent headaches: no ?Visual changes: no ?Palpitations: no ?Dyspnea: no ?Chest pain: no ?Lower extremity edema: no ?Dizzy/lightheaded: no  ? ?HYPOTHYROIDISM ?Continues on Levothyroxine 75 MCG daily.  Has been out since Thursday.  History of low Vitamin D level, she continues daily supplement. ?Thyroid control status:stable ?Satisfied with current treatment? no ?Medication side effects: no ?Medication compliance: good compliance ?Etiology of hypothyroidism:  ?Recent dose adjustment:no ?Fatigue: no ?Cold intolerance: no ?Heat intolerance: no ?Weight gain: no ?Weight loss: no ?Constipation: no ?Diarrhea/loose stools: no ?Palpitations: no ?Lower extremity edema: no ?Anxiety/depressed mood: no ? ?COPD ?No current inhaler regimen.  Recent low dose CT performed on 06/18/21 with benign appearance -- notes emphysema and aortic atherosclerosis. Currently smokes 1 PPD, has been smoking since she was 24.  ?COPD status: stable ?Satisfied with current treatment?: yes ?Oxygen use:  no ?Dyspnea frequency:  none ?Cough frequency: occasional ?Rescue inhaler frequency:  none ?Limitation of activity: no ?Productive cough: none ?Last Spirometry: November 2020 ?Pneumovax: Not Up to Date ?Influenza: Up to Date  ? ?ANXIETY ?Continues on Celexa 20 MG daily -- endorses increased sadness due to husband passing in November 2021 unexpectedly.   ?Mood status: stable ?Satisfied with current treatment?: yes ?Symptom severity: mild  ?Duration of current treatment : chronic ?Side effects: no ?Medication compliance: good compliance ?Psychotherapy/counseling: none ?Depressed mood: yes ?Anxious mood: no ?Anhedonia: no ?Significant weight loss or gain: no ?Insomnia: yes hard to fall asleep -- naps during daytime ?Fatigue: at times ?Feelings of worthlessness or guilt: no ?Impaired concentration/indecisiveness: no ?Suicidal ideations: no ?Hopelessness: no ?Crying spells: yes ? ?  11/22/2021  ?  8:29 AM 09/30/2021  ? 11:29 AM 05/24/2021  ?  2:06 PM 10/14/2020  ?  8:29 AM 09/28/2020  ? 11:18 AM  ?Depression screen PHQ 2/9  ?Decreased Interest 2 1 2 1  0  ?Down, Depressed, Hopeless 1 0 1 1 0  ?PHQ - 2 Score 3 1 3 2  0  ?Altered sleeping 2 1 1  0   ?Tired, decreased energy 1 0 1 1   ?Change in appetite 0 0 0 0   ?Feeling bad or failure about yourself  0 0 0 0   ?Trouble concentrating 0 0 0    ?Moving slowly or fidgety/restless 0 0 0 0   ?Suicidal thoughts 0 0 0 0   ?PHQ-9 Score 6 2 5 3    ?Difficult doing work/chores Somewhat difficult Not difficult at all Somewhat difficult Not difficult at all   ? ? ?  11/22/2021  ?  8:30  AM 05/29/2019  ?  8:15 AM  ?GAD 7 : Generalized Anxiety Score  ?Nervous, Anxious, on Edge 0 0  ?Control/stop worrying 1 1  ?Worry too much - different things 0 1  ?Trouble relaxing 1 0  ?Restless 0 0  ?Easily annoyed or irritable 0 0  ?Afraid - awful might happen 0 0  ?Total GAD 7 Score 2 2  ?Anxiety Difficulty Not difficult at all Not difficult at all  ? ?Relevant past medical, surgical, family and social  history reviewed and updated as indicated. Interim medical history since our last visit reviewed. ?Allergies and medications reviewed and updated. ? ?Review of Systems  ?Constitutional:  Negative for activity change, appetite change, diaphoresis, fatigue and fever.  ?Respiratory:  Negative for cough, chest tightness and shortness of breath.   ?Cardiovascular:  Negative for chest pain, palpitations and leg swelling.  ?Gastrointestinal: Negative.   ?Neurological: Negative.   ?Psychiatric/Behavioral: Negative.    ? ?Per HPI unless specifically indicated above ? ?   ?Objective:  ?  ?BP 124/79   Pulse 69   Temp 98.5 ?F (36.9 ?C) (Oral)   Ht 4' 9.75" (1.467 m)   Wt 133 lb (60.3 kg)   LMP  (LMP Unknown)   SpO2 95%   BMI 28.04 kg/m?   ?Wt Readings from Last 3 Encounters:  ?11/22/21 133 lb (60.3 kg)  ?06/18/21 130 lb (59 kg)  ?05/24/21 130 lb 3.2 oz (59.1 kg)  ?  ?Physical Exam ?Vitals and nursing note reviewed.  ?Constitutional:   ?   General: She is awake. She is not in acute distress. ?   Appearance: She is well-developed and well-groomed. She is not ill-appearing.  ?HENT:  ?   Head: Normocephalic.  ?   Right Ear: Hearing normal.  ?   Left Ear: Hearing normal.  ?Eyes:  ?   General: Lids are normal.     ?   Right eye: No discharge.     ?   Left eye: No discharge.  ?   Conjunctiva/sclera: Conjunctivae normal.  ?   Pupils: Pupils are equal, round, and reactive to light.  ?Neck:  ?   Vascular: No carotid bruit.  ?Cardiovascular:  ?   Rate and Rhythm: Normal rate and regular rhythm.  ?   Heart sounds: Normal heart sounds. No murmur heard. ?  No gallop.  ?Pulmonary:  ?   Effort: Pulmonary effort is normal. No accessory muscle usage or respiratory distress.  ?   Breath sounds: Normal breath sounds.  ?Abdominal:  ?   General: Bowel sounds are normal.  ?   Palpations: Abdomen is soft.  ?Musculoskeletal:  ?   Cervical back: Normal range of motion and neck supple.  ?   Right lower leg: No edema.  ?   Left lower leg: No edema.   ?Skin: ?   General: Skin is warm and dry.  ?Neurological:  ?   Mental Status: She is alert and oriented to person, place, and time.  ?Psychiatric:     ?   Attention and Perception: Attention normal.     ?   Mood and Affect: Mood normal.     ?   Speech: Speech normal.     ?   Behavior: Behavior normal. Behavior is cooperative.     ?   Thought Content: Thought content normal.  ? ?Results for orders placed or performed in visit on 05/24/21  ?CBC with Differential/Platelet  ?Result Value Ref Range  ? WBC 8.9 3.4 -  10.8 x10E3/uL  ? RBC 4.00 3.77 - 5.28 x10E6/uL  ? Hemoglobin 12.4 11.1 - 15.9 g/dL  ? Hematocrit 35.3 34.0 - 46.6 %  ? MCV 88 79 - 97 fL  ? MCH 31.0 26.6 - 33.0 pg  ? MCHC 35.1 31.5 - 35.7 g/dL  ? RDW 13.9 11.7 - 15.4 %  ? Platelets 249 150 - 450 x10E3/uL  ? Neutrophils 60 Not Estab. %  ? Lymphs 29 Not Estab. %  ? Monocytes 7 Not Estab. %  ? Eos 3 Not Estab. %  ? Basos 1 Not Estab. %  ? Neutrophils Absolute 5.4 1.4 - 7.0 x10E3/uL  ? Lymphocytes Absolute 2.6 0.7 - 3.1 x10E3/uL  ? Monocytes Absolute 0.6 0.1 - 0.9 x10E3/uL  ? EOS (ABSOLUTE) 0.2 0.0 - 0.4 x10E3/uL  ? Basophils Absolute 0.1 0.0 - 0.2 x10E3/uL  ? Immature Granulocytes 0 Not Estab. %  ? Immature Grans (Abs) 0.0 0.0 - 0.1 x10E3/uL  ?Comprehensive metabolic panel  ?Result Value Ref Range  ? Glucose 75 70 - 99 mg/dL  ? BUN 18 8 - 27 mg/dL  ? Creatinine, Ser 0.74 0.57 - 1.00 mg/dL  ? eGFR 88 >59 mL/min/1.73  ? BUN/Creatinine Ratio 24 12 - 28  ? Sodium 139 134 - 144 mmol/L  ? Potassium 3.9 3.5 - 5.2 mmol/L  ? Chloride 103 96 - 106 mmol/L  ? CO2 21 20 - 29 mmol/L  ? Calcium 8.9 8.7 - 10.3 mg/dL  ? Total Protein 6.7 6.0 - 8.5 g/dL  ? Albumin 4.3 3.8 - 4.8 g/dL  ? Globulin, Total 2.4 1.5 - 4.5 g/dL  ? Albumin/Globulin Ratio 1.8 1.2 - 2.2  ? Bilirubin Total 0.3 0.0 - 1.2 mg/dL  ? Alkaline Phosphatase 70 44 - 121 IU/L  ? AST 13 0 - 40 IU/L  ? ALT 12 0 - 32 IU/L  ?Lipid Panel w/o Chol/HDL Ratio  ?Result Value Ref Range  ? Cholesterol, Total 168 100 - 199 mg/dL   ? Triglycerides 475 (H) 0 - 149 mg/dL  ? HDL 31 (L) >39 mg/dL  ? VLDL Cholesterol Cal 73 (H) 5 - 40 mg/dL  ? LDL Chol Calc (NIH) 64 0 - 99 mg/dL  ?TSH  ?Result Value Ref Range  ? TSH 0.530 0.450 - 4.500 uIU/mL  ?T4, free

## 2021-11-22 NOTE — Assessment & Plan Note (Signed)
Chronic, ongoing.  Continue current medication regimen and adjust as needed.  Thyroid labs up to date and stable recent check. Refills sent in. ?

## 2021-11-22 NOTE — Assessment & Plan Note (Signed)
Chronic, stable. Denies SI/HI.  Continue current medication regimen and adjust as needed, however will add on Buspar 10 MG daily with Celexa to help with some of her ongoing grieving. Would ultimately like to utilize Wellbutrin to assist with depressive elements & smoking cessation, but at this time is on Metoprolol and will avoid this.  Refills sent.  Recommend hospice support group for grieving. ?

## 2021-11-23 ENCOUNTER — Other Ambulatory Visit: Payer: Self-pay | Admitting: Nurse Practitioner

## 2021-11-23 LAB — LIPID PANEL W/O CHOL/HDL RATIO
Cholesterol, Total: 178 mg/dL (ref 100–199)
HDL: 35 mg/dL — ABNORMAL LOW (ref >39)
LDL Chol Calc (NIH): 80 mg/dL (ref 0–99)
Triglycerides: 394 mg/dL — ABNORMAL HIGH (ref 0–149)
VLDL Cholesterol Cal: 63 mg/dL — ABNORMAL HIGH (ref 5–40)

## 2021-11-23 LAB — BASIC METABOLIC PANEL
BUN/Creatinine Ratio: 18 (ref 12–28)
BUN: 13 mg/dL (ref 8–27)
CO2: 22 mmol/L (ref 20–29)
Calcium: 9.1 mg/dL (ref 8.7–10.3)
Chloride: 102 mmol/L (ref 96–106)
Creatinine, Ser: 0.72 mg/dL (ref 0.57–1.00)
Glucose: 99 mg/dL (ref 70–99)
Potassium: 3.9 mmol/L (ref 3.5–5.2)
Sodium: 139 mmol/L (ref 134–144)
eGFR: 90 mL/min/{1.73_m2} (ref >59)

## 2021-11-23 LAB — VITAMIN D 25 HYDROXY (VIT D DEFICIENCY, FRACTURES): Vit D, 25-Hydroxy: 17.5 ng/mL — ABNORMAL LOW (ref 30.0–100.0)

## 2021-11-23 MED ORDER — CHOLECALCIFEROL 1.25 MG (50000 UT) PO TABS: 1.0000 | ORAL_TABLET | ORAL | 4 refills | Status: DC

## 2021-11-23 NOTE — Progress Notes (Signed)
Good morning, please let Bristyl know her labs have returned: ?- Kidney function, creatinine and eGFR, remains normal, as are electrolytes. ?- Vitamin D level remains on low side, I would like her to stop daily Vitamin D and start the higher weekly dose of Vitamin D I have sent in for her to take, will recheck next visit. ?- Cholesterol labs continue to show some elevation in triglycerides, but LDL (bad cholesterol) close to goal.  Were you fasting?  If not please ensure you are fasting next visit so we can ensure we do not need to adjust Atorvastatin.  Please continue this medication daily.  Any questions? ?Keep being stellar!!  Thank you for allowing me to participate in your care.  I appreciate you. ?Kindest regards, ?Shawnna Pancake ?

## 2022-05-22 NOTE — Patient Instructions (Signed)

## 2022-05-25 ENCOUNTER — Encounter: Payer: Self-pay | Admitting: Nurse Practitioner

## 2022-05-25 ENCOUNTER — Ambulatory Visit (INDEPENDENT_AMBULATORY_CARE_PROVIDER_SITE_OTHER): Payer: Medicare HMO | Admitting: Nurse Practitioner

## 2022-05-25 VITALS — BP 128/78 | HR 66 | Temp 98.5°F | Ht <= 58 in | Wt 133.7 lb

## 2022-05-25 DIAGNOSIS — I10 Essential (primary) hypertension: Secondary | ICD-10-CM

## 2022-05-25 DIAGNOSIS — Z0000 Encounter for general adult medical examination without abnormal findings: Secondary | ICD-10-CM

## 2022-05-25 DIAGNOSIS — E063 Autoimmune thyroiditis: Secondary | ICD-10-CM | POA: Diagnosis not present

## 2022-05-25 DIAGNOSIS — E782 Mixed hyperlipidemia: Secondary | ICD-10-CM | POA: Diagnosis not present

## 2022-05-25 DIAGNOSIS — E559 Vitamin D deficiency, unspecified: Secondary | ICD-10-CM | POA: Diagnosis not present

## 2022-05-25 DIAGNOSIS — F419 Anxiety disorder, unspecified: Secondary | ICD-10-CM

## 2022-05-25 DIAGNOSIS — F17219 Nicotine dependence, cigarettes, with unspecified nicotine-induced disorders: Secondary | ICD-10-CM

## 2022-05-25 DIAGNOSIS — R69 Illness, unspecified: Secondary | ICD-10-CM | POA: Diagnosis not present

## 2022-05-25 DIAGNOSIS — Z23 Encounter for immunization: Secondary | ICD-10-CM | POA: Diagnosis not present

## 2022-05-25 DIAGNOSIS — I700 Atherosclerosis of aorta: Secondary | ICD-10-CM

## 2022-05-25 DIAGNOSIS — J432 Centrilobular emphysema: Secondary | ICD-10-CM | POA: Diagnosis not present

## 2022-05-25 NOTE — Assessment & Plan Note (Signed)
Chronic, noted on imaging.  Continue daily statin and recommend complete cessation of smoking.  Recommend daily Baby ASA 81 MG.  If symptoms present will refer to cardiology.

## 2022-05-25 NOTE — Assessment & Plan Note (Signed)
Chronic, ongoing.  Continue current medication regimen and adjust as needed.  Thyroid labs today. 

## 2022-05-25 NOTE — Assessment & Plan Note (Signed)
Ongoing, taking supplement, recheck Vit D today.  Recommend change to daily Vitamin D 3 2000 units.

## 2022-05-25 NOTE — Progress Notes (Signed)
BP 128/78 (BP Location: Left Arm, Patient Position: Sitting, Cuff Size: Normal)   Pulse 66   Temp 98.5 F (36.9 C) (Oral)   Ht 4' 9.25" (1.454 m)   Wt 133 lb 11.2 oz (60.6 kg)   LMP  (LMP Unknown)   SpO2 94%   BMI 28.68 kg/m    Subjective:    Patient ID: Savannah Whitaker, female    DOB: May 12, 1951, 71 y.o.   MRN: [id number]  HPI: Savannah Whitaker is a 71 y.o. female presenting on 05/25/2022 for comprehensive medical examination. Current medical complaints include:none  She currently lives with: self Menopausal Symptoms: no   COPD No current inhaler regimen.  CT screening last in November 06/18/21 -- is scheduled for next one 06/21/22.  Currently smokes 1 PPD, has been smoking since she was 24.  Not interested in quitting.   COPD status: stable Satisfied with current treatment?: yes Oxygen use: no Dyspnea frequency: none Cough frequency: chronic, at baseline Rescue inhaler frequency: none Limitation of activity: no Productive cough: none Last Spirometry: November 2020 Pneumovax:  refused Influenza: Up to Date  HYPERTENSION / HYPERLIPIDEMIA Continues on Atorvastatin, Amlodipine, and Metoprolol. CT imaging noted lung two-vessel coronary atherosclerosis was noted. Satisfied with current treatment? yes Duration of hypertension: chronic BP monitoring frequency: rarely BP range: 120-130/70-80 BP medication side effects: no Duration of hyperlipidemia: chronic Cholesterol medication side effects: no Cholesterol supplements: none Medication compliance: good compliance Aspirin: no Recent stressors: no Recurrent headaches: no Visual changes: no Palpitations: no Dyspnea: no Chest pain: no Lower extremity edema: no Dizzy/lightheaded: no   HYPOTHYROIDISM Continues on Levothyroxine 75 MCG daily. Thyroid control status:stable Satisfied with current treatment? yes Medication side effects: no Medication compliance: good compliance Etiology of hypothyroidism:   Recent dose adjustment:no Fatigue: no Cold intolerance: no Heat intolerance: no Weight gain: no Weight loss: no Constipation: no Diarrhea/loose stools: no Palpitations: no Lower extremity edema: no Anxiety/depressed mood: no   VITAMIN D DEFICIENCY: Continues on supplements.  No recent falls or fractures.  Declines DEXA screening and mammogram screening.  ANXIETY/STRESS Continues on Citalopram 20 MG daily.  Husband passed 2 years ago. Duration: stable Anxious mood: yes  Excessive worrying: no Irritability: no  Sweating: no Nausea: no Palpitations:no Hyperventilation: no Panic attacks: no Agoraphobia: no  Obscessions/compulsions: no Depressed mood: no    05/25/2022    9:08 AM 11/22/2021    8:29 AM 09/30/2021   11:29 AM 05/24/2021    2:06 PM 10/14/2020    8:29 AM  Depression screen PHQ 2/9  Decreased Interest 0 _0 Down, Depressed, Hopeless 0 1 0 1 1  PHQ - 2 Score 0 _1 Altered sleeping 0 _2 0  Tired, decreased energy 0 1 0 1 1  Change in appetite 0 0 0 0 0  Feeling bad or failure about yourself  0 0 0 0 0  Trouble concentrating 0 0 0 0   Moving slowly or fidgety/restless 0 0 0 0 0  Suicidal thoughts 0 0 0 0 0  PHQ-9 Score 0 _3 Difficult doing work/chores Not difficult at all Somewhat difficult Not difficult at all Somewhat difficult Not difficult at all  Anhedonia: no Weight changes: no Insomnia: no  none   Hypersomnia: no Fatigue/loss of energy: no Feelings of worthlessness: no Feelings of guilt: no Impaired concentration/indecisiveness: no Suicidal ideations: no  Crying spells: no Recent Stressors/Life Changes: no   Relationship problems: no  Family stress: no     Financial stress: no    Job stress: no    Recent death/loss: no    2022-06-12    9:08 AM 11/22/2021    8:30 AM 05/29/2019    8:15 AM  GAD 7 : Generalized Anxiety Score  Nervous, Anxious, on Edge 0 0 0  Control/stop worrying 0 1 1  Worry too much - different things 0  0 1  Trouble relaxing 0 1 0  Restless 0 0 0  Easily annoyed or irritable 0 0 0  Afraid - awful might happen 0 0 0  Total GAD 7 Score 0 2 2  Anxiety Difficulty Not difficult at all Not difficult at all Not difficult at all         2022-06-12    9:07 AM 09/30/2021   11:25 AM 05/24/2021    2:11 PM 09/28/2020   11:17 AM 09/26/2019    8:21 AM  [place] in the past year? 0 0 0 1 0  Comment    tripped over curb   Number falls in past yr: 0 0 0 1 0  Injury with Fall? 0 0 0 0 0  Risk for fall due to : No Fall Risks  No Fall Risks Medication side effect   Follow up Falls evaluation completed Falls evaluation completed;Falls prevention discussed Falls prevention discussed Falls evaluation completed;Education provided;Falls prevention discussed     Functional Status Survey: Is the patient deaf or have difficulty hearing?: No Does the patient have difficulty seeing, even when wearing glasses/contacts?: No Does the patient have difficulty concentrating, remembering, or making decisions?: No Does the patient have difficulty walking or climbing stairs?: No Does the patient have difficulty dressing or bathing?: No Does the patient have difficulty doing errands alone such as visiting a doctor's office or shopping?: No    Past Medical History:  Past Medical History:  Diagnosis Date   Anxiety    Hyperlipidemia    Hypertension    Hypertriglyceridemia    IFG (impaired fasting glucose)    Osteoarthritis    Thyroid disease    Tobacco use disorder    Vitamin D deficiency     Surgical History:  Past Surgical History:  Procedure Laterality Date   CARPAL TUNNEL RELEASE Right    TUBAL LIGATION      Medications:  Current Outpatient Medications on File Prior to Visit  Medication Sig   amLODipine (NORVASC) 2.5 MG tablet Take 1 tablet (2.5 mg total) by mouth daily.   atorvastatin (LIPITOR) 40 MG tablet Take 1 tablet (40 mg total) by mouth daily at 6 PM.   Cholecalciferol 1.25 MG  (50000 UT) TABS Take 1 tablet by mouth once a week.   citalopram (CELEXA) 20 MG tablet Take 1 tablet (20 mg total) by mouth daily.   levothyroxine (SYNTHROID) 75 MCG tablet Take 1 tablet (75 mcg total) by mouth daily.   metoprolol succinate (TOPROL-XL) 50 MG 24 hr tablet Take with or immediately following a meal.   No current facility-administered medications on file prior to visit.    Allergies:  No Known Allergies  Social History:  Social History   Socioeconomic History   Marital status: Widowed    Spouse name: Not on file   Number of children: Not on file   Years of education: Not on file   Highest education level: High school graduate  Occupational History   Occupation: semi retired  Tobacco Use   Smoking status: Every  Day    Packs/day: 1.00    Years: 40.00    Total pack years: 40.00    Types: Cigarettes   Smokeless tobacco: Never  Vaping Use   Vaping Use: Never used  Substance and Sexual Activity   Alcohol use: Yes    Alcohol/week: 0.0 standard drinks of alcohol    Comment: occasional wine.    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    Social Determinants of Health   Financial Resource Strain: Low Risk  (09/30/2021)   Overall Financial Resource Strain (CARDIA)    Difficulty of Paying Living Expenses: Not hard at all  Food Insecurity: No Food Insecurity (09/30/2021)   Hunger Vital Sign    Worried About Running Out of Food in the Last Year: Never true    Ran Out of Food in the Last Year: Never true  Transportation Needs: No Transportation Needs (09/30/2021)   PRAPARE - Hydrologist (Medical): No    Lack of Transportation (Non-Medical): No  Physical Activity: Inactive (09/30/2021)   Exercise Vital Sign    Days of Exercise per Week: 0 days    Minutes of Exercise per Session: 0 min  Stress: No Stress  Concern Present (09/30/2021)   [place]    Feeling of Stress : Not at all  Social Connections: Moderately Isolated (09/30/2021)   Social Connection and Isolation Panel [NHANES]    Frequency of Communication with Friends and Family: More than three times a week    Frequency of Social Gatherings with Friends and Family: Once a week    Attends Religious Services: More than 4 times per year    Active Member of [employer] or Organizations: No    Attends Archivist Meetings: Never    Marital Status: Widowed  Intimate Partner Violence: Not At Risk (09/30/2021)   Humiliation, Afraid, Rape, and Kick questionnaire    Fear of Current or Ex-Partner: No    Emotionally Abused: No    Physically Abused: No    Sexually Abused: No   Social History   Tobacco Use  Smoking Status Every Day   Packs/day: 1.00   Years: 40.00   Total pack years: 40.00   Types: Cigarettes  Smokeless Tobacco Never   Social History   Substance and Sexual Activity  Alcohol Use Yes   Alcohol/week: 0.0 standard drinks of alcohol   Comment: occasional wine.     Family History:  Family History  Problem Relation Age of Onset   Lung cancer Mother    Heart disease Father    Colon cancer Brother    Alcohol abuse Brother    Cancer Brother        mouth   Breast cancer Neg Hx     Past medical history, surgical history, medications, allergies, family history and social history reviewed with patient today and changes made to appropriate areas of the chart.   Review of Systems - negative All other ROS negative except what is listed above and in the HPI.      Objective:    BP 128/78 (BP Location: Left Arm, Patient Position: Sitting, Cuff Size: Normal)   Pulse 66   Temp 98.5 F (36.9 C) (Oral)   Ht 4' 9.25" (1.454 m)   Wt  133 lb 11.2 oz (60.6 kg)   LMP  (LMP Unknown)   SpO2 94%   BMI 28.68 kg/m   Wt Readings from Last 3 Encounters:  05/25/22 133  lb 11.2 oz (60.6 kg)  11/22/21 133 lb (60.3 kg)  06/18/21 130 lb (59 kg)    Physical Exam Vitals and nursing note reviewed.  Constitutional:      General: She is awake. She is not in acute distress.    Appearance: She is well-developed and well-groomed. She is not ill-appearing or toxic-appearing.  HENT:     Head: Normocephalic and atraumatic.     Right Ear: Hearing, tympanic membrane, ear canal and external ear normal. No drainage.     Left Ear: Hearing, tympanic membrane, ear canal and external ear normal. No drainage.     Nose: Nose normal.     Right Sinus: No maxillary sinus tenderness or frontal sinus tenderness.     Left Sinus: No maxillary sinus tenderness or frontal sinus tenderness.     Mouth/Throat:     Mouth: Mucous membranes are moist.     Pharynx: Oropharynx is clear. Uvula midline. No pharyngeal swelling, oropharyngeal exudate or posterior oropharyngeal erythema.  Eyes:     General: Lids are normal.        Right eye: No discharge.        Left eye: No discharge.     Extraocular Movements: Extraocular movements intact.     Conjunctiva/sclera: Conjunctivae normal.     Pupils: Pupils are equal, round, and reactive to light.     Visual Fields: Right eye visual fields normal and left eye visual fields normal.  Neck:     Thyroid: No thyromegaly.     Vascular: No carotid bruit.     Trachea: Trachea normal.  Cardiovascular:     Rate and Rhythm: Normal rate and regular rhythm.     Heart sounds: Normal heart sounds. No murmur heard.    No gallop.  Pulmonary:     Effort: Pulmonary effort is normal. No accessory muscle usage or respiratory distress.     Breath sounds: Normal breath sounds.     Comments: Occasional expiratory wheezes noted throughout. Chest:     Comments: Refuses breast exam today. Abdominal:     General: Bowel sounds are normal.     Palpations: Abdomen is soft. There is no hepatomegaly or splenomegaly.     Tenderness: There is no abdominal tenderness.   Musculoskeletal:        General: Normal range of motion.     Cervical back: Normal range of motion and neck supple.     Right lower leg: No edema.     Left lower leg: No edema.  Lymphadenopathy:     Head:     Right side of head: No submental, submandibular, tonsillar, preauricular or posterior auricular adenopathy.     Left side of head: No submental, submandibular, tonsillar, preauricular or posterior auricular adenopathy.     Cervical: No cervical adenopathy.     Upper Body:     Right upper body: No supraclavicular, axillary or pectoral adenopathy.     Left upper body: No supraclavicular, axillary or pectoral adenopathy.  Skin:    General: Skin is warm and dry.     Capillary Refill: Capillary refill takes less than 2 seconds.     Findings: No rash.  Neurological:     Mental Status: She is alert and oriented to person, place, and time.     Gait: Gait  is intact.     Deep Tendon Reflexes: Reflexes are normal and symmetric.     Reflex Scores:      Brachioradialis reflexes are 2+ on the right side and 2+ on the left side.      Patellar reflexes are 2+ on the right side and 2+ on the left side. Psychiatric:        Attention and Perception: Attention normal.        Mood and Affect: Mood normal.        Speech: Speech normal.        Behavior: Behavior normal. Behavior is cooperative.        Thought Content: Thought content normal.        Judgment: Judgment normal.    Results for orders placed or performed in visit on 44/92/01  Basic metabolic panel  Result Value Ref Range   Glucose 99 70 - 99 mg/dL   BUN 13 8 - 27 mg/dL   Creatinine, Ser 0.72 0.57 - 1.00 mg/dL   eGFR 90 >59 mL/min/1.73   BUN/Creatinine Ratio 18 12 - 28   Sodium 139 134 - 144 mmol/L   Potassium 3.9 3.5 - 5.2 mmol/L   Chloride 102 96 - 106 mmol/L   CO2 22 20 - 29 mmol/L   Calcium 9.1 8.7 - 10.3 mg/dL  Lipid Panel w/o Chol/HDL Ratio  Result Value Ref Range   Cholesterol, Total 178 100 - 199 mg/dL    Triglycerides 394 (H) 0 - 149 mg/dL   HDL 35 (L) >39 mg/dL   VLDL Cholesterol Cal 63 (H) 5 - 40 mg/dL   LDL Chol Calc (NIH) 80 0 - 99 mg/dL  VITAMIN D 25 Hydroxy (Vit-D Deficiency, Fractures)  Result Value Ref Range   Vit D, 25-Hydroxy 17.5 (L) 30.0 - 100.0 ng/mL      Assessment & Plan:   Problem List Items Addressed This Visit       Cardiovascular and Mediastinum   Aortic atherosclerosis (HCC)    Chronic, noted on imaging.  Continue daily statin and recommend complete cessation of smoking.  Recommend daily Baby ASA 81 MG.  If symptoms present will refer to cardiology.      Relevant Orders   Comprehensive metabolic panel   Lipid Panel w/o Chol/HDL Ratio   Hypertension    Chronic, ongoing with initial BP elevated (had recent cigarette) and repeat at goal + home BP at goal. Continue Metoprolol and Amlodipine and adjust as needed.  Recommend she monitor BP at least a few mornings a week at home and document.  DASH diet at home.  Labs today: CMP, CBC, TSH.  Return in 6 months.       Relevant Orders   CBC with Differential/Platelet   Comprehensive metabolic panel     Respiratory   Centrilobular emphysema (HCC) - Primary    Chronic, ongoing with no inhalers.  Last FEV1/FVC 110% and FEV1 86% in 2020 -- unable to repeat today -- will plan on next visit. Recommend complete cessation of smoking and continue yearly CT screening.  Plan on monitoring lung function annually and add inhalers as needed.  Spirometry next visit.  CBC today.      Relevant Orders   CBC with Differential/Platelet     Endocrine   Hashimoto's thyroiditis    Chronic, ongoing.  Continue current medication regimen and adjust as needed.  Thyroid labs today.      Relevant Orders   T4, free   TSH  Nervous and Auditory   Nicotine dependence, cigarettes, w unsp disorders    I have recommended complete cessation of tobacco use. I have discussed various options available for assistance with tobacco cessation  including over the counter methods (Nicotine gum, patch and lozenges). We also discussed prescription options (Chantix, Nicotine Inhaler / Nasal Spray). The patient is not interested in pursuing any prescription tobacco cessation options at this time.          Other   Chronic anxiety    Chronic, stable. Denies SI/HI.  Continue current medication regimen and adjust as needed. Would ultimately like to utilize Wellbutrin to assist with depressive elements & smoking cessation, but at this time is on Metoprolol and will avoid this.       Hyperlipidemia    Chronic, ongoing.  Continue Atorvastatin 40 MG and recheck lipid today (fasting).  Adjust as needed. Triglycerides in past elevated, was not fasting.  If continue elevation consider addition of Fenofibrate to regimen.      Relevant Orders   Comprehensive metabolic panel   Lipid Panel w/o Chol/HDL Ratio   Vitamin D deficiency    Ongoing, taking supplement, recheck Vit D today.  Recommend change to daily Vitamin D 3 2000 units.      Relevant Orders   VITAMIN D 25 Hydroxy (Vit-D Deficiency, Fractures)   Other Visit Diagnoses     Flu vaccine need       Flu vaccine in office today.   Relevant Orders   Flu Vaccine QUAD High Dose(Fluad)   Encounter for annual physical exam       Annual physical today with labs and health maintenance reviewed, discussed with patient.        Follow up plan: Return in about 6 months (around 11/24/2022) for COPD, HTN/HLD, MOOD -- need spirometry.   LABORATORY TESTING:  - Pap smear: not applicable  IMMUNIZATIONS:   - Tdap: Tetanus vaccination status reviewed: refuses - Influenza: Up to date - Pneumovax: Agrees to PCV20 next visit, not available in office today - Prevnar: Refused - HPV: Not applicable - Zostavax vaccine: Refused  SCREENING: -Mammogram: Refused  - Colonoscopy: Refused  - Bone Density: Refused  -Hearing Test: Not applicable  -Spirometry: Not applicable   PATIENT COUNSELING:    Advised to take 1 mg of folate supplement per day if capable of pregnancy.   Sexuality: Discussed sexually transmitted diseases, partner selection, use of condoms, avoidance of unintended pregnancy  and contraceptive alternatives.   Advised to avoid cigarette smoking.  I discussed with the patient that most people either abstain from alcohol or drink within safe limits (<=14/week and <=4 drinks/occasion for males, <=7/weeks and <= 3 drinks/occasion for females) and that the risk for alcohol disorders and other health effects rises proportionally with the number of drinks per week and how often a drinker exceeds daily limits.  Discussed cessation/primary prevention of drug use and availability of treatment for abuse.   Diet: Encouraged to adjust caloric intake to maintain  or achieve ideal body weight, to reduce intake of dietary saturated fat and total fat, to limit sodium intake by avoiding high sodium foods and not adding table salt, and to maintain adequate dietary potassium and calcium preferably from fresh fruits, vegetables, and low-fat dairy products.    Stressed the importance of regular exercise  Injury prevention: Discussed safety belts, safety helmets, smoke detector, smoking near bedding or upholstery.   Dental health: Discussed importance of regular tooth brushing, flossing, and dental visits.  NEXT PREVENTATIVE PHYSICAL DUE IN 1 YEAR. Return in about 6 months (around 11/24/2022) for COPD, HTN/HLD, MOOD -- need spirometry.

## 2022-05-25 NOTE — Assessment & Plan Note (Signed)
I have recommended complete cessation of tobacco use. I have discussed various options available for assistance with tobacco cessation including over the counter methods (Nicotine gum, patch and lozenges). We also discussed prescription options (Chantix, Nicotine Inhaler / Nasal Spray). The patient is not interested in pursuing any prescription tobacco cessation options at this time.  

## 2022-05-25 NOTE — Assessment & Plan Note (Signed)
Chronic, ongoing with no inhalers.  Last FEV1/FVC 110% and FEV1 86% in 2020 -- unable to repeat today -- will plan on next visit. Recommend complete cessation of smoking and continue yearly CT screening.  Plan on monitoring lung function annually and add inhalers as needed.  Spirometry next visit.  CBC today.

## 2022-05-25 NOTE — Assessment & Plan Note (Signed)
Chronic, stable. Denies SI/HI.  Continue current medication regimen and adjust as needed. Would ultimately like to utilize Wellbutrin to assist with depressive elements & smoking cessation, but at this time is on Metoprolol and will avoid this.

## 2022-05-25 NOTE — Assessment & Plan Note (Signed)
Chronic, ongoing.  Continue Atorvastatin 40 MG and recheck lipid today (fasting).  Adjust as needed. Triglycerides in past elevated, was not fasting.  If continue elevation consider addition of Fenofibrate to regimen.

## 2022-05-25 NOTE — Assessment & Plan Note (Signed)
Chronic, ongoing with initial BP elevated (had recent cigarette) and repeat at goal + home BP at goal. Continue Metoprolol and Amlodipine and adjust as needed.  Recommend she monitor BP at least a few mornings a week at home and document.  DASH diet at home.  Labs today: CMP, CBC, TSH.  Return in 6 months.

## 2022-05-26 ENCOUNTER — Other Ambulatory Visit: Payer: Self-pay | Admitting: Nurse Practitioner

## 2022-05-26 LAB — COMPREHENSIVE METABOLIC PANEL
ALT: 13 IU/L (ref 0–32)
AST: 16 IU/L (ref 0–40)
Albumin/Globulin Ratio: 1.2 (ref 1.2–2.2)
Albumin: 4.1 g/dL (ref 3.9–4.9)
Alkaline Phosphatase: 77 IU/L (ref 44–121)
BUN/Creatinine Ratio: 20 (ref 12–28)
BUN: 14 mg/dL (ref 8–27)
Bilirubin Total: 0.6 mg/dL (ref 0.0–1.2)
CO2: 25 mmol/L (ref 20–29)
Calcium: 9.7 mg/dL (ref 8.7–10.3)
Chloride: 101 mmol/L (ref 96–106)
Creatinine, Ser: 0.71 mg/dL (ref 0.57–1.00)
Globulin, Total: 3.3 g/dL (ref 1.5–4.5)
Glucose: 93 mg/dL (ref 70–99)
Potassium: 3.9 mmol/L (ref 3.5–5.2)
Sodium: 140 mmol/L (ref 134–144)
Total Protein: 7.4 g/dL (ref 6.0–8.5)
eGFR: 91 mL/min/{1.73_m2} (ref >59)

## 2022-05-26 LAB — T4, FREE: Free T4: 1.14 ng/dL (ref 0.82–1.77)

## 2022-05-26 LAB — CBC WITH DIFFERENTIAL/PLATELET
Basophils Absolute: 0.1 10*3/uL (ref 0.0–0.2)
Basos: 1 %
EOS (ABSOLUTE): 0.2 10*3/uL (ref 0.0–0.4)
Eos: 3 %
Hematocrit: 41.6 % (ref 34.0–46.6)
Hemoglobin: 13.9 g/dL (ref 11.1–15.9)
Immature Grans (Abs): 0 10*3/uL (ref 0.0–0.1)
Immature Granulocytes: 0 %
Lymphocytes Absolute: 2 10*3/uL (ref 0.7–3.1)
Lymphs: 25 %
MCH: 30.5 pg (ref 26.6–33.0)
MCHC: 33.4 g/dL (ref 31.5–35.7)
MCV: 91 fL (ref 79–97)
Monocytes Absolute: 0.4 10*3/uL (ref 0.1–0.9)
Monocytes: 5 %
Neutrophils Absolute: 5.4 10*3/uL (ref 1.4–7.0)
Neutrophils: 66 %
Platelets: 249 10*3/uL (ref 150–450)
RBC: 4.56 x10E6/uL (ref 3.77–5.28)
RDW: 13.5 % (ref 11.7–15.4)
WBC: 8.2 10*3/uL (ref 3.4–10.8)

## 2022-05-26 LAB — LIPID PANEL W/O CHOL/HDL RATIO
Cholesterol, Total: 190 mg/dL (ref 100–199)
HDL: 37 mg/dL — ABNORMAL LOW (ref >39)
LDL Chol Calc (NIH): 85 mg/dL (ref 0–99)
Triglycerides: 417 mg/dL — ABNORMAL HIGH (ref 0–149)
VLDL Cholesterol Cal: 68 mg/dL — ABNORMAL HIGH (ref 5–40)

## 2022-05-26 LAB — TSH: TSH: 0.61 u[IU]/mL (ref 0.450–4.500)

## 2022-05-26 LAB — VITAMIN D 25 HYDROXY (VIT D DEFICIENCY, FRACTURES): Vit D, 25-Hydroxy: 59.9 ng/mL (ref 30.0–100.0)

## 2022-05-26 MED ORDER — ROSUVASTATIN CALCIUM 40 MG PO TABS: 40.0000 mg | ORAL_TABLET | Freq: Every day | ORAL | 3 refills | Status: DC

## 2022-05-26 NOTE — Progress Notes (Signed)
Good morning, please let Savannah Whitaker know her labs have returned and are overall nice and stable with exception of cholesterol levels.  I am going to stop her Atorvastatin and start Rosuvastatin 40 MG daily.  Will send in this change and request she stop Atorvastatin and start Rosuvastatin once she obtains.  We will recheck levels next visit fasting.  Any questions? Keep being amazing!!  Thank you for allowing me to participate in your care.  I appreciate you. Kindest regards, Dagny Fiorentino

## 2022-06-20 ENCOUNTER — Ambulatory Visit: Payer: Medicare HMO

## 2022-06-21 ENCOUNTER — Ambulatory Visit
Admission: RE | Admit: 2022-06-21 | Discharge: 2022-06-21 | Disposition: A | Discharge status: Home / Self Care | Payer: Medicare HMO | Source: Ambulatory Visit | Attending: Acute Care | Admitting: Acute Care

## 2022-06-21 DIAGNOSIS — F17210 Nicotine dependence, cigarettes, uncomplicated: Secondary | ICD-10-CM | POA: Insufficient documentation

## 2022-06-21 DIAGNOSIS — R69 Illness, unspecified: Secondary | ICD-10-CM | POA: Diagnosis not present

## 2022-06-21 DIAGNOSIS — Z87891 Personal history of nicotine dependence: Secondary | ICD-10-CM | POA: Insufficient documentation

## 2022-06-21 DIAGNOSIS — F17200 Nicotine dependence, unspecified, uncomplicated: Secondary | ICD-10-CM | POA: Insufficient documentation

## 2022-06-24 ENCOUNTER — Telehealth: Payer: Self-pay | Admitting: Acute Care

## 2022-06-24 DIAGNOSIS — Z87891 Personal history of nicotine dependence: Secondary | ICD-10-CM

## 2022-06-24 DIAGNOSIS — F17210 Nicotine dependence, cigarettes, uncomplicated: Secondary | ICD-10-CM

## 2022-06-24 DIAGNOSIS — R911 Solitary pulmonary nodule: Secondary | ICD-10-CM

## 2022-06-24 NOTE — Addendum Note (Signed)
Addended by: Karlton Lemon on: 06/24/2022 12:20 PM   Modules accepted: Orders

## 2022-06-24 NOTE — Telephone Encounter (Signed)
Left VM for patient to call for results of LDCT.   

## 2022-06-24 NOTE — Telephone Encounter (Signed)
Call report received LCS CT :  CLINICAL DATA:  71-year-old female with 45 pack-year history of smoking. Lung cancer screening.   EXAM: CT CHEST WITHOUT CONTRAST LOW-DOSE FOR LUNG CANCER SCREENING   TECHNIQUE: Multidetector CT imaging of the chest was performed following the standard protocol without IV contrast.   RADIATION DOSE REDUCTION: This exam was performed according to the departmental dose-optimization program which includes automated exposure control, adjustment of the mA and/or kV according to patient size and/or use of iterative reconstruction technique.   COMPARISON:  06/18/2021   FINDINGS: Cardiovascular: The heart size is normal. No substantial pericardial effusion. Coronary artery calcification is evident. Mild atherosclerotic calcification is noted in the wall of the thoracic aorta.   Mediastinum/Nodes: No mediastinal lymphadenopathy. No evidence for gross hilar lymphadenopathy although assessment is limited by the lack of intravenous contrast on the current study. The esophagus has normal imaging features. There is no axillary lymphadenopathy.   Lungs/Pleura: Centrilobular and paraseptal emphysema evident. Previously identified tiny pulmonary nodules are stable in the interval. There is a new 5.5 mm subpleural nodule in the paraspinal left upper lobe (image 78). No focal airspace consolidation. No pleural effusion.   Upper Abdomen: Unremarkable.   Musculoskeletal: No worrisome lytic or sclerotic osseous abnormality.   IMPRESSION: 1. Lung-RADS 3, probably benign findings. New 5.5 mm left upper lobe subpleural nodule. Short-term follow-up in 6 months is recommended with repeat low-dose chest CT without contrast (please use the following order, "CT CHEST LCS NODULE FOLLOW-UP W/O CM"). 2. Aortic Atherosclerosis (ICD10-I70.0) and Emphysema (ICD10-J43.9).  Sarah please advise.

## 2022-06-24 NOTE — Telephone Encounter (Signed)
Spoke with patient by phone to review results of LDCT.  Emphysema and atherosclerosis, as previously noted.  New nodule left upper lobe with recommendation to repeat CT in 6 months as precaution.  Patient has had some increased cough and congestion recently. No fever.  Patient acknowledged understanding and agrees to 6 month nodule follow up.  Order placed and results faxed to PCP.

## 2022-09-21 ENCOUNTER — Telehealth: Payer: Self-pay | Admitting: Nurse Practitioner

## 2022-09-21 NOTE — Telephone Encounter (Signed)
Copied from [place] [id number]. Topic: Medicare AWV >> Sep 21, 2022  1:44 PM Devoria Glassing wrote: Reason for CRM: Called patient to schedule Medicare Annual Wellness Visit (AWV). Left message for patient to call back and schedule Medicare Annual Wellness Visit (AWV).  Last date of AWV: 09/30/2021  Please schedule an appointment at any time with Kirke Shaggy, [facility]    If any questions, please contact me.  Thank you ,  Sherol Dade; [place] Direct Dial: [phone]

## 2022-11-01 ENCOUNTER — Ambulatory Visit (INDEPENDENT_AMBULATORY_CARE_PROVIDER_SITE_OTHER): Payer: Medicare HMO

## 2022-11-01 VITALS — Ht <= 58 in | Wt 133.0 lb

## 2022-11-01 DIAGNOSIS — Z0000 Encounter for general adult medical examination without abnormal findings: Secondary | ICD-10-CM

## 2022-11-01 NOTE — Progress Notes (Signed)
I connected with  Savannah Whitaker on 11/01/22 by a audio enabled telemedicine application and verified that I am speaking with the correct person using two identifiers.  Patient Location: Home  Provider Location: Office/Clinic  I discussed the limitations of evaluation and management by telemedicine. The patient expressed understanding and agreed to proceed.  Subjective:   Savannah Whitaker is a 72 y.o. female who presents for Medicare Annual (Subsequent) preventive examination.  Review of Systems     Cardiac Risk Factors include: advanced age (>50men, >3 women);hypertension;smoking/ tobacco exposure     Objective:    There were no vitals filed for this visit. There is no height or weight on file to calculate BMI.     11/01/2022   11:31 AM 09/30/2021   11:25 AM 09/28/2020   11:16 AM 09/26/2019    8:27 AM 03/29/2018    9:35 AM 07/08/2015    2:06 AM  Advanced Directives  Does Patient Have a Medical Advance Directive? No No No No No No  Would patient like information on creating a medical advance directive? No - Patient declined No - Patient declined   No - Patient declined Yes - Educational materials given    Current Medications (verified) Outpatient Encounter Medications as of 11/01/2022  Medication Sig   amLODipine (NORVASC) 2.5 MG tablet Take 1 tablet (2.5 mg total) by mouth daily.   Cholecalciferol 1.25 MG (50000 UT) TABS Take 1 tablet by mouth once a week.   citalopram (CELEXA) 20 MG tablet Take 1 tablet (20 mg total) by mouth daily.   levothyroxine (SYNTHROID) 75 MCG tablet Take 1 tablet (75 mcg total) by mouth daily.   metoprolol succinate (TOPROL-XL) 50 MG 24 hr tablet Take with or immediately following a meal.   rosuvastatin (CRESTOR) 40 MG tablet Take 1 tablet (40 mg total) by mouth daily.   No facility-administered encounter medications on file as of 11/01/2022.    Allergies (verified) Patient has no known allergies.   History: Past Medical History:   Diagnosis Date   Anxiety    Hyperlipidemia    Hypertension    Hypertriglyceridemia    IFG (impaired fasting glucose)    Osteoarthritis    Thyroid disease    Tobacco use disorder    Vitamin D deficiency    Past Surgical History:  Procedure Laterality Date   CARPAL TUNNEL RELEASE Right    TUBAL LIGATION     Family History  Problem Relation Age of Onset   Lung cancer Mother    Heart disease Father    Colon cancer Brother    Alcohol abuse Brother    Cancer Brother        mouth   Breast cancer Neg Hx    Social History   Socioeconomic History   Marital status: Widowed    Spouse name: Not on file   Number of children: Not on file   Years of education: Not on file   Highest education level: High school graduate  Occupational History   Occupation: semi retired  Tobacco Use   Smoking status: Every Day    Packs/day: 1.00    Years: 40.00    Additional pack years: 0.00    Total pack years: 40.00    Types: Cigarettes   Smokeless tobacco: Never  Vaping Use   Vaping Use: Never used  Substance and Sexual Activity   Alcohol use: Yes    Alcohol/week: 0.0 standard drinks of alcohol    Comment: occasional wine.  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    Social Determinants of Health   Financial Resource Strain: Low Risk  (11/01/2022)   Overall Financial Resource Strain (CARDIA)    Difficulty of Paying Living Expenses: Not hard at all  Food Insecurity: No Food Insecurity (11/01/2022)   Hunger Vital Sign    Worried About Running Out of Food in the Last Year: Never true    Ran Out of Food in the Last Year: Never true  Transportation Needs: No Transportation Needs (11/01/2022)   PRAPARE - Hydrologist (Medical): No    Lack of Transportation (Non-Medical): No  Physical Activity: Insufficiently Active (11/01/2022)    Exercise Vital Sign    Days of Exercise per Week: 2 days    Minutes of Exercise per Session: 20 min  Stress: No Stress Concern Present (11/01/2022)   [place]    Feeling of Stress : Only a little  Social Connections: Moderately Isolated (11/01/2022)   Social Connection and Isolation Panel [NHANES]    Frequency of Communication with Friends and Family: More than three times a week    Frequency of Social Gatherings with Friends and Family: More than three times a week    Attends Religious Services: More than 4 times per year    Active Member of [employer] or Organizations: No    Attends Archivist Meetings: Never    Marital Status: Widowed    Tobacco Counseling Ready to quit: Not Answered Counseling given: Not Answered   Clinical Intake:  Pre-visit preparation completed: Yes  Pain : No/denies pain     Nutritional Risks: None Diabetes: No  How often do you need to have someone help you when you read instructions, pamphlets, or other written materials from your doctor or pharmacy?: 1 - Never  Diabetic?no  Interpreter Needed?: No  Information entered by :: Kirke Shaggy, LPN   Activities of Daily Living    11/01/2022   11:32 AM 05/25/2022    9:31 AM  In your present state of health, do you have any difficulty performing the following activities:  Hearing? 0 0  Vision? 0 0  Difficulty concentrating or making decisions? 0 0  Walking or climbing stairs? 0 0  Dressing or bathing? 0 0  Doing errands, shopping? 0 0  Preparing Food and eating ? N   Using the Toilet? N   In the past six months, have you accidently leaked urine? N   Do you have problems with loss of bowel control? N   Managing your Medications? N   Managing your Finances? N   Housekeeping or managing your Housekeeping? N     Patient Care Team: Venita Lick, NP as PCP - General (Nurse Practitioner) Minna Merritts, MD as  Consulting Physician (Cardiology)  Indicate any recent Medical Services you may have received from other than [facility] providers in the past year (date may be approximate).     Assessment:   This is a routine wellness examination for Savannah Whitaker.  Hearing/Vision screen Hearing Screening - Comments:: No aids Vision Screening - Comments:: Wears glasses-   Dietary issues and exercise activities discussed: Current Exercise Habits: Home exercise routine, Type of exercise: walking, Time (Minutes): 20, Frequency (Times/Week): 2, Weekly Exercise (Minutes/Week): 40,  Intensity: Mild   Goals Addressed             This Visit's Progress    DIET - EAT MORE FRUITS AND VEGETABLES         Depression Screen    11/01/2022   11:30 AM 05/25/2022    9:08 AM 11/22/2021    8:29 AM 09/30/2021   11:29 AM 05/24/2021    2:06 PM 10/14/2020    8:29 AM 09/28/2020   11:18 AM  PHQ 2/9 Scores  PHQ - 2 Score 0 0 3 1 3 2  0  PHQ- 9 Score 0 0 6 2 5 3      Fall Risk    11/01/2022   11:32 AM 05/25/2022    9:07 AM 09/30/2021   11:25 AM 05/24/2021    2:11 PM 09/28/2020   11:17 AM  Fall Risk   Falls in the past year? 0 0 0 0 1  Comment     tripped over curb  Number falls in past yr: 0 0 0 0 1  Injury with Fall? 0 0 0 0 0  Risk for fall due to : No Fall Risks No Fall Risks  No Fall Risks Medication side effect  Follow up Falls prevention discussed;Falls evaluation completed Falls evaluation completed Falls evaluation completed;Falls prevention discussed Falls prevention discussed Falls evaluation completed;Education provided;Falls prevention discussed    FALL RISK PREVENTION PERTAINING TO THE HOME:  Any stairs in or around the home? Yes  If so, are there any without handrails? No  Home free of loose throw rugs in walkways, pet beds, electrical cords, etc? Yes  Adequate lighting in your home to reduce risk of falls? Yes   ASSISTIVE DEVICES UTILIZED TO PREVENT FALLS:  Life alert? No  Use of a cane, walker or w/c? No   Grab bars in the bathroom? No  Shower chair or bench in shower? No  Elevated toilet seat or a handicapped toilet? No    Cognitive Function:        11/01/2022   11:38 AM 09/28/2020   11:20 AM 03/29/2018    9:35 AM  6CIT Screen  What Year? 0 points 0 points 0 points  What month? 0 points 0 points 0 points  What time? 0 points 0 points 0 points  Count back from 20 0 points 0 points 0 points  Months in reverse 0 points 0 points 0 points  Repeat phrase 0 points 2 points 0 points  Total Score 0 points 2 points 0 points    Immunizations Immunization History  Administered Date(s) Administered   Fluad Quad(high Dose 65+) 05/29/2019, 05/24/2021, 05/25/2022   Influenza, High Dose Seasonal PF 07/13/2017, 06/01/2018   Influenza,inj,quad, With Preservative 06/19/2020   Td 06/10/2005    TDAP status: Due, Education has been provided regarding the importance of this vaccine. Advised may receive this vaccine at local pharmacy or Health Dept. Aware to provide a copy of the vaccination record if obtained from local pharmacy or Health Dept. Verbalized acceptance and understanding.  Flu Vaccine status: Up to date  Pneumococcal vaccine status: Declined,  Education has been provided regarding the importance of this vaccine but patient still declined. Advised may receive this vaccine at local pharmacy or Health Dept. Aware to provide a copy of the vaccination record if obtained from local pharmacy or Health Dept. Verbalized acceptance and understanding.   Covid-19 vaccine status: Declined, Education has been provided regarding the importance of this vaccine but patient still declined. Advised may receive  this vaccine at local pharmacy or Health Dept.or vaccine clinic. Aware to provide a copy of the vaccination record if obtained from local pharmacy or Health Dept. Verbalized acceptance and understanding.  Qualifies for Shingles Vaccine? Yes   Zostavax completed No   Shingrix Completed?: No.     Education has been provided regarding the importance of this vaccine. Patient has been advised to call insurance company to determine out of pocket expense if they have not yet received this vaccine. Advised may also receive vaccine at local pharmacy or Health Dept. Verbalized acceptance and understanding.  Screening Tests Health Maintenance  Topic Date Due   Zoster Vaccines- Shingrix (1 of 2) Never done   DTaP/Tdap/Td (2 - Tdap) 06/11/2015   MAMMOGRAM  11/23/2022 (Originally 07/23/2001)   DEXA SCAN  11/23/2022 (Originally 07/23/2016)   COLONOSCOPY (Pts 45-24yrs Insurance coverage will need to be confirmed)  11/23/2022 (Originally 07/23/1996)   Pneumonia Vaccine 47+ Years old (1 of 2 - PCV) 05/16/2023 (Originally 07/23/1957)   INFLUENZA VACCINE  03/02/2023   Lung Cancer Screening  06/22/2023   Medicare Annual Wellness (AWV)  11/01/2023   Hepatitis C Screening  Completed   HPV VACCINES  Aged Out   COVID-19 Vaccine  Discontinued    Health Maintenance  Health Maintenance Due  Topic Date Due   Zoster Vaccines- Shingrix (1 of 2) Never done   DTaP/Tdap/Td (2 - Tdap) 06/11/2015    Colorectal cancer screening: Type of screening: Colonoscopy. Completed postponed until 11/23/22. Repeat every 10 years  Mammogram status: Completed postponed until 11/23/22. Repeat every year  Bone Density status: Completed postponed until 11/23/22. Results reflect: Bone density results: NORMAL. Repeat every 5 years.  Lung Cancer Screening: (Low Dose CT Chest recommended if Age 64-80 years, 30 pack-year currently smoking OR have quit w/in 15years.) does qualify.   Lung Cancer Screening Referral: ordered on 06/24/22- had one done 06/21/22- MD wants to have another one scheduled in 6 months  Additional Screening:  Hepatitis C Screening: does qualify; Completed 05/27/15  Vision Screening: Recommended annual ophthalmology exams for early detection of glaucoma and other disorders of the eye. Is the patient up to  date with their annual eye exam?  No  Who is the provider or what is the name of the office in which the patient attends annual eye exams? No one If pt is not established with a provider, would they like to be referred to a provider to establish care? No .   Dental Screening: Recommended annual dental exams for proper oral hygiene  Community Resource Referral / Chronic Care Management: CRR required this visit?  No   CCM required this visit?  No      Plan:     I have personally reviewed and noted the following in the patient's chart:   Medical and social history Use of alcohol, tobacco or illicit drugs  Current medications and supplements including opioid prescriptions. Patient is not currently taking opioid prescriptions. Functional ability and status Nutritional status Physical activity Advanced directives List of other physicians Hospitalizations, surgeries, and ER visits in previous 12 months Vitals Screenings to include cognitive, depression, and falls Referrals and appointments  In addition, I have reviewed and discussed with patient certain preventive protocols, quality metrics, and best practice recommendations. A written personalized care plan for preventive services as well as general preventive health recommendations were provided to patient.     Dionisio David, LPN   [id number]   Nurse Notes: none

## 2022-11-01 NOTE — Patient Instructions (Signed)
Ms. Bouck , Thank you for taking time to come for your Medicare Wellness Visit. I appreciate your ongoing commitment to your health goals. Please review the following plan we discussed and let me know if I can assist you in the future.   These are the goals we discussed:  Goals      DIET - EAT MORE FRUITS AND VEGETABLES     Patient Stated     09/28/2020, eat healthier     Quit Smoking     Smoking cessation discussed     Weight (lb) < 200 lb (90.7 kg)     Quit smoking        This is a list of the screening recommended for you and due dates:  Health Maintenance  Topic Date Due   Zoster (Shingles) Vaccine (1 of 2) Never done   DTaP/Tdap/Td vaccine (2 - Tdap) 06/11/2015   Mammogram  11/23/2022*   DEXA scan (bone density measurement)  11/23/2022*   Colon Cancer Screening  11/23/2022*   Pneumonia Vaccine (1 of 2 - PCV) 05/16/2023*   Flu Shot  03/02/2023   Screening for Lung Cancer  06/22/2023   Medicare Annual Wellness Visit  11/01/2023   Hepatitis C Screening: USPSTF Recommendation to screen - Ages 18-79 yo.  Completed   HPV Vaccine  Aged Out   COVID-19 Vaccine  Discontinued  *Topic was postponed. The date shown is not the original due date.    Advanced directives: no  Conditions/risks identified: none  Next appointment: Follow up in one year for your annual wellness visit 11/06/23 @ 10:45 am by phone   Preventive Care 65 Years and Older, Female Preventive care refers to lifestyle choices and visits with your health care provider that can promote health and wellness. What does preventive care include? A yearly physical exam. This is also called an annual well check. Dental exams once or twice a year. Routine eye exams. Ask your health care provider how often you should have your eyes checked. Personal lifestyle choices, including: Daily care of your teeth and gums. Regular physical activity. Eating a healthy diet. Avoiding tobacco and drug use. Limiting alcohol  use. Practicing safe sex. Taking low-dose aspirin every day. Taking vitamin and mineral supplements as recommended by your health care provider. What happens during an annual well check? The services and screenings done by your health care provider during your annual well check will depend on your age, overall health, lifestyle risk factors, and family history of disease. Counseling  Your health care provider may ask you questions about your: Alcohol use. Tobacco use. Drug use. Emotional well-being. Home and relationship well-being. Sexual activity. Eating habits. History of falls. Memory and ability to understand (cognition). Work and work Statistician. Reproductive health. Screening  You may have the following tests or measurements: Height, weight, and BMI. Blood pressure. Lipid and cholesterol levels. These may be checked every 5 years, or more frequently if you are over 77 years old. Skin check. Lung cancer screening. You may have this screening every year starting at age 75 if you have a 30-pack-year history of smoking and currently smoke or have quit within the past 15 years. Fecal occult blood test (FOBT) of the stool. You may have this test every year starting at age 88. Flexible sigmoidoscopy or colonoscopy. You may have a sigmoidoscopy every 5 years or a colonoscopy every 10 years starting at age 28. Hepatitis C blood test. Hepatitis B blood test. Sexually transmitted disease (STD) testing. Diabetes screening. This  is done by checking your blood sugar (glucose) after you have not eaten for a while (fasting). You may have this done every 1-3 years. Bone density scan. This is done to screen for osteoporosis. You may have this done starting at age 29. Mammogram. This may be done every 1-2 years. Talk to your health care provider about how often you should have regular mammograms. Talk with your health care provider about your test results, treatment options, and if necessary,  the need for more tests. Vaccines  Your health care provider may recommend certain vaccines, such as: Influenza vaccine. This is recommended every year. Tetanus, diphtheria, and acellular pertussis (Tdap, Td) vaccine. You may need a Td booster every 10 years. Zoster vaccine. You may need this after age 20. Pneumococcal 13-valent conjugate (PCV13) vaccine. One dose is recommended after age 72. Pneumococcal polysaccharide (PPSV23) vaccine. One dose is recommended after age 63. Talk to your health care provider about which screenings and vaccines you need and how often you need them. This information is not intended to replace advice given to you by your health care provider. Make sure you discuss any questions you have with your health care provider. Document Released: 08/14/2015 Document Revised: 04/06/2016 Document Reviewed: 05/19/2015 Elsevier Interactive Patient Education  2017 [place] Prevention in the Home Falls can cause injuries. They can happen to people of all ages. There are many things you can do to make your home safe and to help prevent falls. What can I do on the outside of my home? Regularly fix the edges of walkways and driveways and fix any cracks. Remove anything that might make you trip as you walk through a door, such as a raised step or threshold. Trim any bushes or trees on the path to your home. Use bright outdoor lighting. Clear any walking paths of anything that might make someone trip, such as rocks or tools. Regularly check to see if handrails are loose or broken. Make sure that both sides of any steps have handrails. Any raised decks and porches should have guardrails on the edges. Have any leaves, snow, or ice cleared regularly. Use sand or salt on walking paths during winter. Clean up any spills in your garage right away. This includes oil or grease spills. What can I do in the bathroom? Use night lights. Install grab bars by the toilet and in the  tub and shower. Do not use towel bars as grab bars. Use non-skid mats or decals in the tub or shower. If you need to sit down in the shower, use a plastic, non-slip stool. Keep the floor dry. Clean up any water that spills on the floor as soon as it happens. Remove soap buildup in the tub or shower regularly. Attach bath mats securely with double-sided non-slip rug tape. Do not have throw rugs and other things on the floor that can make you trip. What can I do in the bedroom? Use night lights. Make sure that you have a light by your bed that is easy to reach. Do not use any sheets or blankets that are too big for your bed. They should not hang down onto the floor. Have a firm chair that has side arms. You can use this for support while you get dressed. Do not have throw rugs and other things on the floor that can make you trip. What can I do in the kitchen? Clean up any spills right away. Avoid walking on wet floors. Keep items that  you use a lot in easy-to-reach places. If you need to reach something above you, use a strong step stool that has a grab bar. Keep electrical cords out of the way. Do not use floor polish or wax that makes floors slippery. If you must use wax, use non-skid floor wax. Do not have throw rugs and other things on the floor that can make you trip. What can I do with my stairs? Do not leave any items on the stairs. Make sure that there are handrails on both sides of the stairs and use them. Fix handrails that are broken or loose. Make sure that handrails are as long as the stairways. Check any carpeting to make sure that it is firmly attached to the stairs. Fix any carpet that is loose or worn. Avoid having throw rugs at the top or bottom of the stairs. If you do have throw rugs, attach them to the floor with carpet tape. Make sure that you have a light switch at the top of the stairs and the bottom of the stairs. If you do not have them, ask someone to add them for  you. What else can I do to help prevent falls? Wear shoes that: Do not have high heels. Have rubber bottoms. Are comfortable and fit you well. Are closed at the toe. Do not wear sandals. If you use a stepladder: Make sure that it is fully opened. Do not climb a closed stepladder. Make sure that both sides of the stepladder are locked into place. Ask someone to hold it for you, if possible. Clearly mark and make sure that you can see: Any grab bars or handrails. First and last steps. Where the edge of each step is. Use tools that help you move around (mobility aids) if they are needed. These include: Canes. Walkers. Scooters. Crutches. Turn on the lights when you go into a dark area. Replace any light bulbs as soon as they burn out. Set up your furniture so you have a clear path. Avoid moving your furniture around. If any of your floors are uneven, fix them. If there are any pets around you, be aware of where they are. Review your medicines with your doctor. Some medicines can make you feel dizzy. This can increase your chance of falling. Ask your doctor what other things that you can do to help prevent falls. This information is not intended to replace advice given to you by your health care provider. Make sure you discuss any questions you have with your health care provider. Document Released: 05/14/2009 Document Revised: 12/24/2015 Document Reviewed: 08/22/2014 Elsevier Interactive Patient Education  2017 [employer].

## 2022-11-24 ENCOUNTER — Ambulatory Visit: Payer: Medicare HMO | Admitting: Nurse Practitioner

## 2022-11-24 DIAGNOSIS — E782 Mixed hyperlipidemia: Secondary | ICD-10-CM

## 2022-11-24 DIAGNOSIS — E063 Autoimmune thyroiditis: Secondary | ICD-10-CM

## 2022-11-24 DIAGNOSIS — F419 Anxiety disorder, unspecified: Secondary | ICD-10-CM

## 2022-11-24 DIAGNOSIS — I700 Atherosclerosis of aorta: Secondary | ICD-10-CM

## 2022-11-24 DIAGNOSIS — F17219 Nicotine dependence, cigarettes, with unspecified nicotine-induced disorders: Secondary | ICD-10-CM

## 2022-11-24 DIAGNOSIS — J432 Centrilobular emphysema: Secondary | ICD-10-CM

## 2022-11-24 DIAGNOSIS — I10 Essential (primary) hypertension: Secondary | ICD-10-CM

## 2022-12-03 NOTE — Patient Instructions (Signed)
Living with COPD Being diagnosed with chronic obstructive pulmonary disease (COPD) changes your life physically and emotionally. Having COPD can affect your ability to work and do things you enjoy. COPD is not the same for everyone, and it may change over time. Your health care providers can help you come up with the COPD management plan that works best for you. How to manage lifestyle changes Treatment plan Work closely with your health care providers. Follow your COPD management plan. This plan includes: Instructions about activities, exercises, diet, medicines, what to do when COPD flares up, and when to call your health care provider. A pulmonary rehabilitation program. In pulmonary rehab, you will learn about COPD, do exercises for fitness and breathing, and get support from health care providers and other people who have COPD. Managing emotions and stress Living with a chronic disease means you may also struggle with stressful emotions, such as sadness, fear, and worry. Here are some ways to manage these emotions: Talk to someone about your fear, anxiety, depression, or stress. Learn strategies to avoid or reduce stress and ask for help if you are struggling with depression or anxiety. Consider joining a COPD support group, online or in person.  Adjusting to changes COPD may limit the things you can do, but you can make certain changes to help you cope with the diagnosis. Ask for help when you need it. Getting support from friends, family, and your health care team is an important part of managing the condition. Try to get regular exercise as prescribed by a health care provider or pulmonary rehab team. Exercising can help COPD, even if you are a bit short of breath. Take steps to prevent infection and protect your lungs: Wash your hands often and avoid being in crowds. Stay away from friends and family members who are sick. Check your local air quality each day, and stay out of areas  where air pollution is likely. How to recognize changes in your condition Recognizing changes in your COPD COPD is a progressive disease. It is important to let the health care team know if your COPD is getting worse. Your treatment plan may need to change. Watch for: Increased shortness of breath, wheezing, cough, or fatigue. Loss of ability to exercise or perform daily activities, like climbing stairs. More frequent symptom flares. Signs of depression or anxiety. Recognizing stress It is normal to have additional stress when you have COPD. However, prolonged stress and anxiety can make COPD worse and lead to depression. Recognize the warning signs, which include: Feeling sad or worried more often or most of the time. Having less energy and losing interest in pleasurable activities. Changes in your appetite or sleeping patterns. Being easily angered or irritated. Having unexplained aches and pains, digestive problems, or headaches. Follow these instructions at home: Eating and drinking  Eat foods that are high in fiber, such as fresh fruits and vegetables, whole grains, and beans. Limit foods that are high in fat and processed sugars, such as fried or sweet foods. Follow a balanced diet and maintain a healthy weight. Being overweight or underweight can make COPD worse. You may work with a dietitian as part of your pulmonary rehab program. Drink enough fluid to keep your urine pale yellow. If you drink alcohol: Limit how much you have to: 0-1 drink a day for women who are not pregnant. 0-2 drinks a day for men. Know how much alcohol is in your drink. In the [place] one drink equals one 12 oz bottle   of beer (355 mL), one 5 oz glass of wine (148 mL), or one 1 oz glass of hard liquor (44 mL). Lifestyle If you smoke, the most important thing that you can do is to stop smoking. Continuing to smoke will cause the disease to progress faster. Do not use any products that contain nicotine or  tobacco. These products include cigarettes, chewing tobacco, and vaping devices, such as e-cigarettes. If you need help quitting, ask your health care provider. Avoid exposure to things that irritate your lungs, such as smoke, chemicals, and fumes. Activity Balance exercise and rest. Take short walks every 1-2 hours. This is important to improve blood flow and breathing. Ask for help if you feel weak or unsteady. Do exercises that include controlled breathing with body movement, such as tai chi. General instructions Take over-the-counter and prescription medicines only as told by your health care provider. Take vitamin and protein supplements as told by your health care provider or dietitian. Practice good oral hygiene and see your dental care provider regularly. An oral infection can also spread to your lungs. Make sure you receive all the vaccines that your health care provider recommends. Keep all follow-up visits. This is important. Contact a health care provider if you: Are struggling to manage your COPD. Have emotional stress that interferes with your ability to cope with COPD. Get help right away if you: Have thoughts of suicide, death, or hurting yourself or others. If you ever feel like you may hurt yourself or others, or have thoughts about taking your own life, get help right away. Go to your nearest emergency department or: Call your local emergency services (911 in the [place] Call a suicide crisis helpline, such as the National Suicide Prevention Lifeline at 1-800-273-8255 or 988 in the [place] This is open 24 hours a day in the [place] Text the Crisis Text Line at 741741 (in the [place] Summary Being diagnosed with chronic obstructive pulmonary disease (COPD) changes your life physically and emotionally. Work with your health care providers and follow your COPD management plan. A pulmonary rehabilitation program is an important part of COPD management. Prolonged stress, anxiety, and  depression can make COPD worse. Let your health care provider know if emotional stress interferes with your ability to cope with and manage COPD. This information is not intended to replace advice given to you by your health care provider. Make sure you discuss any questions you have with your health care provider. Document Revised: 02/10/2021 Document Reviewed: 08/05/2020 Elsevier Patient Education  2023 [employer] Inc.  

## 2022-12-05 ENCOUNTER — Ambulatory Visit (INDEPENDENT_AMBULATORY_CARE_PROVIDER_SITE_OTHER): Payer: Medicare HMO | Admitting: Nurse Practitioner

## 2022-12-05 ENCOUNTER — Encounter: Payer: Self-pay | Admitting: Nurse Practitioner

## 2022-12-05 VITALS — BP 112/71 | HR 69 | Temp 98.2°F | Ht <= 58 in | Wt 131.5 lb

## 2022-12-05 DIAGNOSIS — E559 Vitamin D deficiency, unspecified: Secondary | ICD-10-CM

## 2022-12-05 DIAGNOSIS — R911 Solitary pulmonary nodule: Secondary | ICD-10-CM | POA: Diagnosis not present

## 2022-12-05 DIAGNOSIS — I10 Essential (primary) hypertension: Secondary | ICD-10-CM | POA: Diagnosis not present

## 2022-12-05 DIAGNOSIS — I700 Atherosclerosis of aorta: Secondary | ICD-10-CM

## 2022-12-05 DIAGNOSIS — J432 Centrilobular emphysema: Secondary | ICD-10-CM

## 2022-12-05 DIAGNOSIS — F419 Anxiety disorder, unspecified: Secondary | ICD-10-CM | POA: Diagnosis not present

## 2022-12-05 DIAGNOSIS — E782 Mixed hyperlipidemia: Secondary | ICD-10-CM | POA: Diagnosis not present

## 2022-12-05 DIAGNOSIS — E063 Autoimmune thyroiditis: Secondary | ICD-10-CM

## 2022-12-05 DIAGNOSIS — F17219 Nicotine dependence, cigarettes, with unspecified nicotine-induced disorders: Secondary | ICD-10-CM | POA: Diagnosis not present

## 2022-12-05 LAB — MICROALBUMIN, URINE WAIVED
Creatinine, Urine Waived: 100 mg/dL (ref 10–300)
Microalb, Ur Waived: 80 mg/L — ABNORMAL HIGH (ref 0–19)

## 2022-12-05 MED ORDER — AMLODIPINE BESYLATE 2.5 MG PO TABS: 2.5000 mg | ORAL_TABLET | Freq: Every day | ORAL | 4 refills | Status: DC

## 2022-12-05 MED ORDER — CITALOPRAM HYDROBROMIDE 20 MG PO TABS
20.0000 mg | ORAL_TABLET | Freq: Every day | ORAL | 4 refills | Status: DC
Start: 2022-12-05 — End: 2023-11-28

## 2022-12-05 MED ORDER — LEVOTHYROXINE SODIUM 75 MCG PO TABS: 75.0000 ug | ORAL_TABLET | Freq: Every day | ORAL | 4 refills | Status: DC

## 2022-12-05 MED ORDER — METOPROLOL SUCCINATE ER 50 MG PO TB24: ORAL_TABLET | ORAL | 4 refills | Status: DC

## 2022-12-05 MED ORDER — ROSUVASTATIN CALCIUM 40 MG PO TABS
40.0000 mg | ORAL_TABLET | Freq: Every day | ORAL | 3 refills | Status: DC
Start: 2022-12-05 — End: 2023-11-28

## 2022-12-05 NOTE — Progress Notes (Signed)
BP 112/71   Pulse 69   Temp 98.2 F (36.8 C) (Oral)   Ht 4' 9.24" (1.454 m)   Wt 131 lb 8 oz (59.6 kg)   LMP  (LMP Unknown)   SpO2 94%   BMI 28.21 kg/m    Subjective:    Patient ID: Savannah Whitaker, female    DOB: [birth year], 72 y.o.   MRN: [id number]  HPI: Savannah Whitaker is a 72 y.o. female  Chief Complaint  Patient presents with   COPD   Hypertension   Hyperlipidemia   Mood   Centrilobular Emphysema   HYPERTENSION / HYPERLIPIDEMIA Continues on Rosuvastatin, Amlodipine,and Metoprolol.  CT imaging lung two-vessel coronary atherosclerosis was noted, similar was noted on previous imaging. Satisfied with current treatment? yes Duration of hypertension: chronic BP monitoring frequency: not checking BP range: not checking BP medication side effects: no Duration of hyperlipidemia: chronic Cholesterol medication side effects: no Cholesterol supplements: none Medication compliance: good compliance Aspirin: no Recent stressors: no Recurrent headaches: no Visual changes: no Palpitations: no Dyspnea: no Chest pain: no Lower extremity edema: no Dizzy/lightheaded: no   HYPOTHYROIDISM Continues on Levothyroxine 75 MCG daily.  History of low Vitamin D level, she continues daily supplement. Thyroid control status:stable Satisfied with current treatment? no Medication side effects: no Medication compliance: good compliance Etiology of hypothyroidism:  Recent dose adjustment:no Fatigue: no Cold intolerance: no Heat intolerance: no Weight gain: no Weight loss: no Constipation: no Diarrhea/loose stools: no Palpitations: no Lower extremity edema: no Anxiety/depressed mood: no  COPD No current inhaler regimen.  Currently smokes 1 PPD, has been smoking since she was 24. Last lung screening 06/21/22 when new nodule was noted, she is to return 12/20/22 for repeat imaging. COPD status: stable Satisfied with current treatment?: yes Oxygen use: no Dyspnea  frequency:  no Cough frequency: occasional, at baseline Rescue inhaler frequency: none, refuses Limitation of activity: no Productive cough: none Last Spirometry: May 2024 -- FEV1 54%, FEV1/FVC 66% Pneumovax: Not Up to Date Influenza: Up to Date   ANXIETY Continues on Celexa 20 MG daily. Mood status: stable Satisfied with current treatment?: yes Symptom severity: mild  Duration of current treatment : chronic Side effects: no Medication compliance: good compliance Psychotherapy/counseling: none Depressed mood: yes, very rare Anxious mood: no Anhedonia: no Significant weight loss or gain: no Insomnia: occasional  -- she goes to work at 4:30 am, then takes a nap with cat at noon Fatigue: occasional Feelings of worthlessness or guilt: no Impaired concentration/indecisiveness: no Suicidal ideations: no Hopelessness: no Crying spells: yes    12/05/2022    2:02 PM 11/01/2022   11:30 AM 05/25/2022    9:08 AM 11/22/2021    8:29 AM 09/30/2021   11:29 AM  Depression screen PHQ 2/9  Decreased Interest 0 0 0 2 1  Down, Depressed, Hopeless 0 0 0 1 0  PHQ - 2 Score 0 0 0 3 1  Altered sleeping 1 0 0 2 1  Tired, decreased energy 1 0 0 1 0  Change in appetite 0 0 0 0 0  Feeling bad or failure about yourself  0 0 0 0 0  Trouble concentrating 0 0 0 0 0  Moving slowly or fidgety/restless 0 0 0 0 0  Suicidal thoughts 0 0 0 0 0  PHQ-9 Score 2 0 0 6 2  Difficult doing work/chores Not difficult at all Not difficult at all Not difficult at all Somewhat difficult Not difficult at all  12/05/2022    2:02 PM 05/25/2022    9:08 AM 11/22/2021    8:30 AM 05/29/2019    8:15 AM  GAD 7 : Generalized Anxiety Score  Nervous, Anxious, on Edge 0 0 0 0  Control/stop worrying 0 0 1 1  Worry too much - different things 0 0 0 1  Trouble relaxing 0 0 1 0  Restless 0 0 0 0  Easily annoyed or irritable 0 0 0 0  Afraid - awful might happen 0 0 0 0  Total GAD 7 Score 0 0 2 2  Anxiety Difficulty Not  difficult at all Not difficult at all Not difficult at all Not difficult at all   Relevant past medical, surgical, family and social history reviewed and updated as indicated. Interim medical history since our last visit reviewed. Allergies and medications reviewed and updated.  Review of Systems  Constitutional:  Negative for activity change, appetite change, diaphoresis, fatigue and fever.  Respiratory:  Negative for cough, chest tightness and shortness of breath.   Cardiovascular:  Negative for chest pain, palpitations and leg swelling.  Gastrointestinal: Negative.   Neurological: Negative.   Psychiatric/Behavioral: Negative.      Per HPI unless specifically indicated above     Objective:    BP 112/71   Pulse 69   Temp 98.2 F (36.8 C) (Oral)   Ht 4' 9.24" (1.454 m)   Wt 131 lb 8 oz (59.6 kg)   LMP  (LMP Unknown)   SpO2 94%   BMI 28.21 kg/m   Wt Readings from Last 3 Encounters:  12/05/22 131 lb 8 oz (59.6 kg)  11/01/22 133 lb (60.3 kg)  05/25/22 133 lb 11.2 oz (60.6 kg)    Physical Exam Vitals and nursing note reviewed.  Constitutional:      General: She is awake. She is not in acute distress.    Appearance: She is well-developed and well-groomed. She is not ill-appearing.  HENT:     Head: Normocephalic.     Right Ear: Hearing normal.     Left Ear: Hearing normal.  Eyes:     General: Lids are normal.        Right eye: No discharge.        Left eye: No discharge.     Conjunctiva/sclera: Conjunctivae normal.     Pupils: Pupils are equal, round, and reactive to light.  Neck:     Vascular: No carotid bruit.  Cardiovascular:     Rate and Rhythm: Normal rate and regular rhythm.     Heart sounds: Normal heart sounds. No murmur heard.    No gallop.  Pulmonary:     Effort: Pulmonary effort is normal. No accessory muscle usage or respiratory distress.     Breath sounds: Normal breath sounds.  Abdominal:     General: Bowel sounds are normal.     Palpations: Abdomen  is soft.  Musculoskeletal:     Cervical back: Normal range of motion and neck supple.     Right lower leg: No edema.     Left lower leg: No edema.  Skin:    General: Skin is warm and dry.  Neurological:     Mental Status: She is alert and oriented to person, place, and time.  Psychiatric:        Attention and Perception: Attention normal.        Mood and Affect: Mood normal.        Speech: Speech normal.  Behavior: Behavior normal. Behavior is cooperative.        Thought Content: Thought content normal.    Results for orders placed or performed in visit on 05/25/22  T4, free  Result Value Ref Range   Free T4 1.14 0.82 - 1.77 ng/dL  CBC with Differential/Platelet  Result Value Ref Range   WBC 8.2 3.4 - 10.8 x10E3/uL   RBC 4.56 3.77 - 5.28 x10E6/uL   Hemoglobin 13.9 11.1 - 15.9 g/dL   Hematocrit 04.5 40.9 - 46.6 %   MCV 91 79 - 97 fL   MCH 30.5 26.6 - 33.0 pg   MCHC 33.4 31.5 - 35.7 g/dL   RDW 81.1 91.4 - 78.2 %   Platelets 249 150 - 450 x10E3/uL   Neutrophils 66 Not Estab. %   Lymphs 25 Not Estab. %   Monocytes 5 Not Estab. %   Eos 3 Not Estab. %   Basos 1 Not Estab. %   Neutrophils Absolute 5.4 1.4 - 7.0 x10E3/uL   Lymphocytes Absolute 2.0 0.7 - 3.1 x10E3/uL   Monocytes Absolute 0.4 0.1 - 0.9 x10E3/uL   EOS (ABSOLUTE) 0.2 0.0 - 0.4 x10E3/uL   Basophils Absolute 0.1 0.0 - 0.2 x10E3/uL   Immature Granulocytes 0 Not Estab. %   Immature Grans (Abs) 0.0 0.0 - 0.1 x10E3/uL  Comprehensive metabolic panel  Result Value Ref Range   Glucose 93 70 - 99 mg/dL   BUN 14 8 - 27 mg/dL   Creatinine, Ser 9.56 0.57 - 1.00 mg/dL   eGFR 91 >21 HY/QMV/7.84   BUN/Creatinine Ratio 20 12 - 28   Sodium 140 134 - 144 mmol/L   Potassium 3.9 3.5 - 5.2 mmol/L   Chloride 101 96 - 106 mmol/L   CO2 25 20 - 29 mmol/L   Calcium 9.7 8.7 - 10.3 mg/dL   Total Protein 7.4 6.0 - 8.5 g/dL   Albumin 4.1 3.9 - 4.9 g/dL   Globulin, Total 3.3 1.5 - 4.5 g/dL   Albumin/Globulin Ratio 1.2 1.2 - 2.2    Bilirubin Total 0.6 0.0 - 1.2 mg/dL   Alkaline Phosphatase 77 44 - 121 IU/L   AST 16 0 - 40 IU/L   ALT 13 0 - 32 IU/L  Lipid Panel w/o Chol/HDL Ratio  Result Value Ref Range   Cholesterol, Total 190 100 - 199 mg/dL   Triglycerides 696 (H) 0 - 149 mg/dL   HDL 37 (L) >29 mg/dL   VLDL Cholesterol Cal 68 (H) 5 - 40 mg/dL   LDL Chol Calc (NIH) 85 0 - 99 mg/dL  TSH  Result Value Ref Range   TSH 0.610 0.450 - 4.500 uIU/mL  VITAMIN D 25 Hydroxy (Vit-D Deficiency, Fractures)  Result Value Ref Range   Vit D, 25-Hydroxy 59.9 30.0 - 100.0 ng/mL      Assessment & Plan:   Problem List Items Addressed This Visit       Cardiovascular and Mediastinum   Aortic atherosclerosis (HCC)    Chronic, noted on imaging.  Continue daily statin and recommend complete cessation of smoking.  Recommend daily Baby ASA 81 MG.  If symptoms present will refer to cardiology.      Relevant Medications   amLODipine (NORVASC) 2.5 MG tablet   metoprolol succinate (TOPROL-XL) 50 MG 24 hr tablet   rosuvastatin (CRESTOR) 40 MG tablet   Other Relevant Orders   Comprehensive metabolic panel   Lipid Panel w/o Chol/HDL Ratio   Hypertension    Chronic, stable.  BP  at goal in office today. Continue Metoprolol and Amlodipine and adjust as needed.  Recommend she monitor BP at least a few mornings a week at home and document.  DASH diet at home.  Labs today: CMP and urine ALB.  Urine ALB 80 May 2024.  Return in 6 months.       Relevant Medications   amLODipine (NORVASC) 2.5 MG tablet   metoprolol succinate (TOPROL-XL) 50 MG 24 hr tablet   rosuvastatin (CRESTOR) 40 MG tablet   Other Relevant Orders   Microalbumin, Urine Waived   Comprehensive metabolic panel     Respiratory   Centrilobular emphysema (HCC) - Primary    Chronic, ongoing with no inhalers.  FEV1 54%, FEV1/FVC 66%. Recommend complete cessation of smoking and continue yearly CT screening.  Plan on monitoring lung function annually.  Currently showing  moderate restriction, have recommended starting Anoro but she wishes to think about this and refuses to start today.  She will alert provider if wishes to trial.  Discussed with her that emphysema is a progressive disease and there are ways to slow down progression -- quitting smoking and using inhalers.        Relevant Orders   Spirometry with graph (Completed)   Lung nodule    Noted on CT lung imaging November 2023, scheduled for follow-up check 12/20/22.        Endocrine   Hashimoto's thyroiditis    Chronic, ongoing.  Continue current medication regimen and adjust as needed.  Thyroid labs up to date.      Relevant Medications   metoprolol succinate (TOPROL-XL) 50 MG 24 hr tablet   levothyroxine (SYNTHROID) 75 MCG tablet     Nervous and Auditory   Nicotine dependence, cigarettes, w unsp disorders    I have recommended complete cessation of tobacco use. I have discussed various options available for assistance with tobacco cessation including over the counter methods (Nicotine gum, patch and lozenges). We also discussed prescription options (Chantix, Nicotine Inhaler / Nasal Spray). The patient is not interested in pursuing any prescription tobacco cessation options at this time.        Relevant Orders   Spirometry with graph (Completed)     Other   Chronic anxiety    Chronic, stable. Denies SI/HI.  Continue current medication regimen and adjust as needed. Would ultimately like to utilize Wellbutrin to assist with depressive elements & smoking cessation, but at this time is on Metoprolol and will avoid this.       Relevant Medications   citalopram (CELEXA) 20 MG tablet   Hyperlipidemia    Chronic, ongoing.  Continue Rosuvastatin and recheck lipid today (fasting).  Adjust as needed. Triglycerides in past elevated, was not fasting.  If continued elevation consider addition of Fenofibrate to regimen.      Relevant Medications   amLODipine (NORVASC) 2.5 MG tablet   metoprolol  succinate (TOPROL-XL) 50 MG 24 hr tablet   rosuvastatin (CRESTOR) 40 MG tablet   Other Relevant Orders   Comprehensive metabolic panel   Lipid Panel w/o Chol/HDL Ratio   Vitamin D deficiency    Ongoing, taking supplement, recheck Vit D next visit.  Recommend change to daily Vitamin D 3 2000 units.        Follow up plan: Return in about 25 weeks (around 05/29/2023) for Annual physical after 05/26/23.

## 2022-12-05 NOTE — Assessment & Plan Note (Signed)
Chronic, stable.  BP at goal in office today. Continue Metoprolol and Amlodipine and adjust as needed.  Recommend she monitor BP at least a few mornings a week at home and document.  DASH diet at home.  Labs today: CMP and urine ALB.  Urine ALB 80 May 2024.  Return in 6 months.

## 2022-12-05 NOTE — Assessment & Plan Note (Addendum)
Chronic, ongoing with no inhalers.  FEV1 54%, FEV1/FVC 66%. Recommend complete cessation of smoking and continue yearly CT screening.  Plan on monitoring lung function annually.  Currently showing moderate restriction, have recommended starting Anoro but Savannah Whitaker wishes to think about this and refuses to start today.  Savannah Whitaker will alert provider if wishes to trial.  Discussed with her that emphysema is a progressive disease and there are ways to slow down progression -- quitting smoking and using inhalers.

## 2022-12-05 NOTE — Assessment & Plan Note (Signed)
Chronic, noted on imaging.  Continue daily statin and recommend complete cessation of smoking.  Recommend daily Baby ASA 81 MG.  If symptoms present will refer to cardiology. 

## 2022-12-05 NOTE — Assessment & Plan Note (Signed)
Noted on CT lung imaging November 2023, scheduled for follow-up check 12/20/22.

## 2022-12-05 NOTE — Assessment & Plan Note (Signed)
Chronic, ongoing.  Continue Rosuvastatin and recheck lipid today (fasting).  Adjust as needed. Triglycerides in past elevated, was not fasting.  If continued elevation consider addition of Fenofibrate to regimen.

## 2022-12-05 NOTE — Assessment & Plan Note (Signed)
Chronic, stable. Denies SI/HI.  Continue current medication regimen and adjust as needed. Would ultimately like to utilize Wellbutrin to assist with depressive elements & smoking cessation, but at this time is on Metoprolol and will avoid this.  

## 2022-12-05 NOTE — Assessment & Plan Note (Signed)
Ongoing, taking supplement, recheck Vit D next visit.  Recommend change to daily Vitamin D 3 2000 units.

## 2022-12-05 NOTE — Assessment & Plan Note (Signed)
I have recommended complete cessation of tobacco use. I have discussed various options available for assistance with tobacco cessation including over the counter methods (Nicotine gum, patch and lozenges). We also discussed prescription options (Chantix, Nicotine Inhaler / Nasal Spray). The patient is not interested in pursuing any prescription tobacco cessation options at this time.  

## 2022-12-05 NOTE — Assessment & Plan Note (Addendum)
Chronic, ongoing.  Continue current medication regimen and adjust as needed.  Thyroid labs up to date. 

## 2022-12-06 LAB — COMPREHENSIVE METABOLIC PANEL
ALT: 16 IU/L (ref 0–32)
AST: 17 IU/L (ref 0–40)
Albumin/Globulin Ratio: 1.6 (ref 1.2–2.2)
Albumin: 4.3 g/dL (ref 3.8–4.8)
Alkaline Phosphatase: 63 IU/L (ref 44–121)
BUN/Creatinine Ratio: 28 (ref 12–28)
BUN: 23 mg/dL (ref 8–27)
Bilirubin Total: 0.5 mg/dL (ref 0.0–1.2)
CO2: 20 mmol/L (ref 20–29)
Calcium: 9.4 mg/dL (ref 8.7–10.3)
Chloride: 103 mmol/L (ref 96–106)
Creatinine, Ser: 0.81 mg/dL (ref 0.57–1.00)
Globulin, Total: 2.7 g/dL (ref 1.5–4.5)
Glucose: 88 mg/dL (ref 70–99)
Potassium: 3.9 mmol/L (ref 3.5–5.2)
Sodium: 138 mmol/L (ref 134–144)
Total Protein: 7 g/dL (ref 6.0–8.5)
eGFR: 78 mL/min/{1.73_m2} (ref >59)

## 2022-12-06 LAB — LIPID PANEL W/O CHOL/HDL RATIO
Cholesterol, Total: 151 mg/dL (ref 100–199)
HDL: 35 mg/dL — ABNORMAL LOW (ref >39)
LDL Chol Calc (NIH): 57 mg/dL (ref 0–99)
Triglycerides: 384 mg/dL — ABNORMAL HIGH (ref 0–149)
VLDL Cholesterol Cal: 59 mg/dL — ABNORMAL HIGH (ref 5–40)

## 2022-12-06 NOTE — Progress Notes (Signed)
Good afternoon, please let Savannah Whitaker know her labs have returned: - Cholesterol labs show at goal LDL, but triglycerides remain a little elevated -- continue current medications for this. - Kidney and liver function are stable.  Any questions? Keep being amazing!!  Thank you for allowing me to participate in your care.  I appreciate you. Kindest regards, Terrall Bley

## 2022-12-20 ENCOUNTER — Ambulatory Visit: Payer: Medicare HMO

## 2022-12-27 ENCOUNTER — Ambulatory Visit
Admission: RE | Admit: 2022-12-27 | Discharge: 2022-12-27 | Disposition: A | Discharge status: Home / Self Care | Payer: Medicare HMO | Source: Ambulatory Visit | Attending: Acute Care | Admitting: Acute Care

## 2022-12-27 DIAGNOSIS — F17210 Nicotine dependence, cigarettes, uncomplicated: Secondary | ICD-10-CM | POA: Diagnosis not present

## 2022-12-27 DIAGNOSIS — Z87891 Personal history of nicotine dependence: Secondary | ICD-10-CM | POA: Insufficient documentation

## 2022-12-27 DIAGNOSIS — R911 Solitary pulmonary nodule: Secondary | ICD-10-CM | POA: Insufficient documentation

## 2023-01-01 IMAGING — CT CT CHEST LUNG CANCER SCREENING LOW DOSE W/O CM
2 of 5 series · 15 of 40 positions shown, 18 images · non-contrast
Comparison: 06/05/2020.

CLINICAL DATA: Current smoker, 44 pack-year history.

EXAM:
CT CHEST WITHOUT CONTRAST LOW-DOSE FOR LUNG CANCER SCREENING
TECHNIQUE: Multidetector CT imaging of the chest was performed following the
standard protocol without IV contrast.

[Series 3: lung 1.00 · axial · 0.57mm/px · z∈[-1194,-898]mm · 12 of 326 slices shown, 15 images]
[im 15/326  mediastinal]
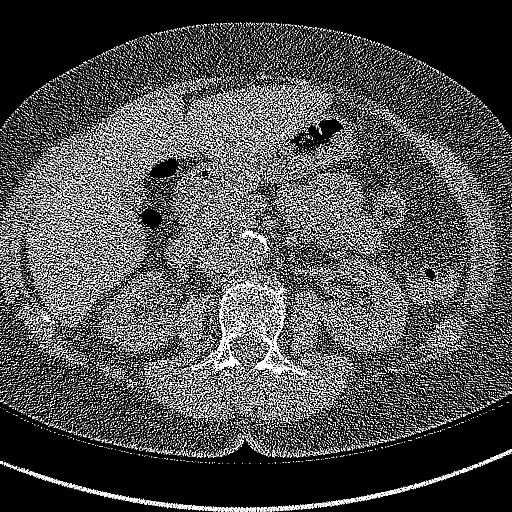
[im 15/326  lung]
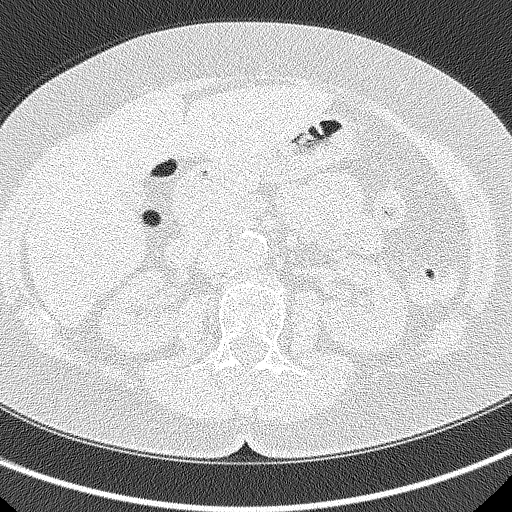
[im 60/326  lung]
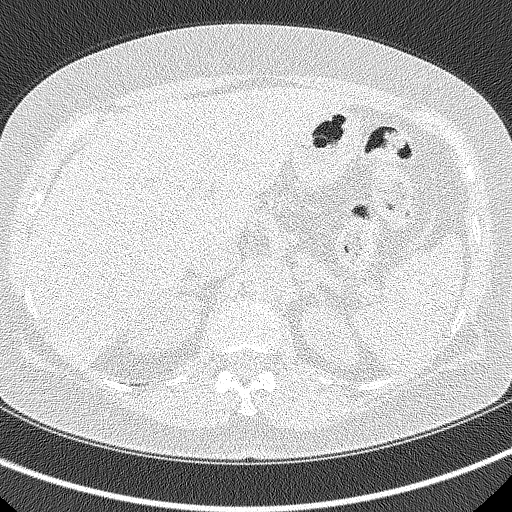
[im 89/326  lung]
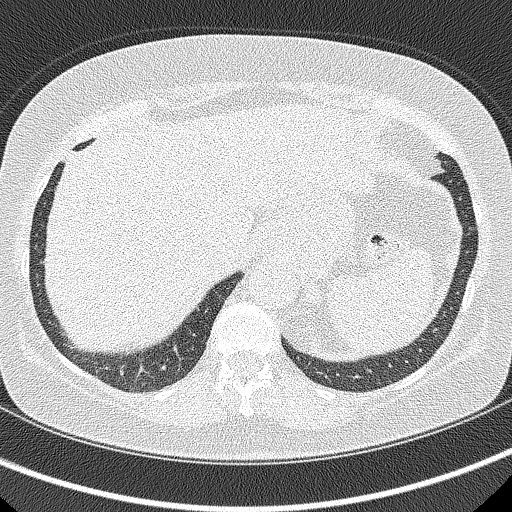
[im 104/326  lung]
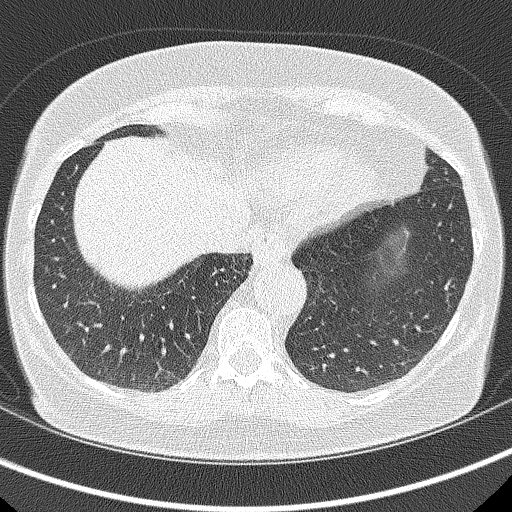
[im 133/326  mediastinal]
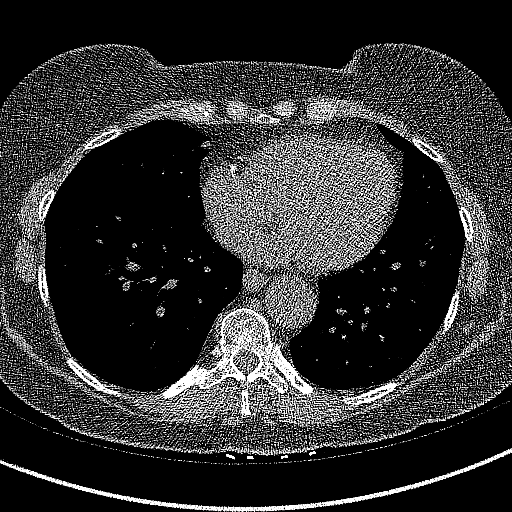
[im 133/326  lung]
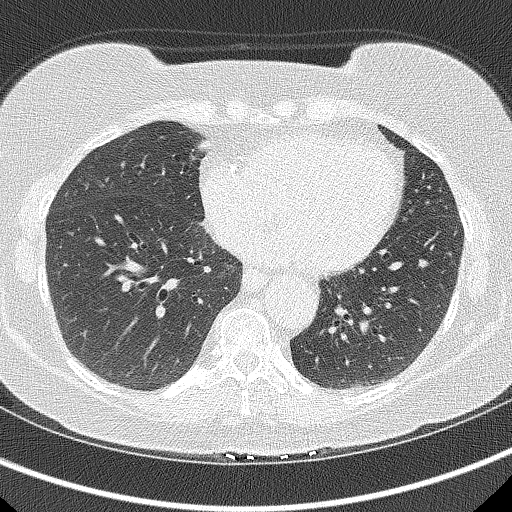
[im 163/326  lung]
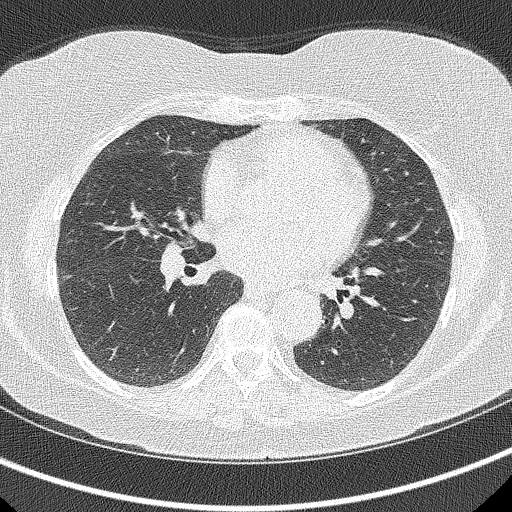
[im 178/326  lung]
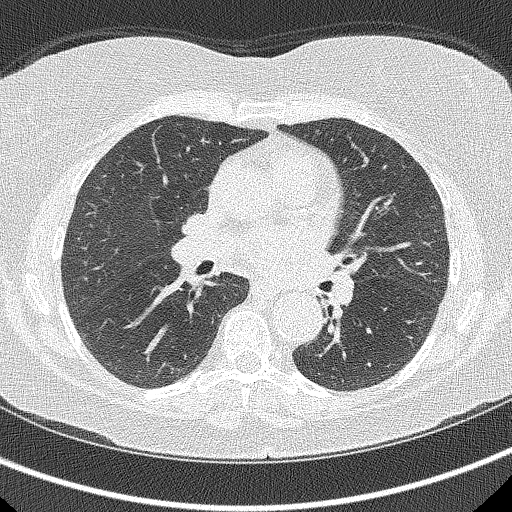
[im 207/326  lung]
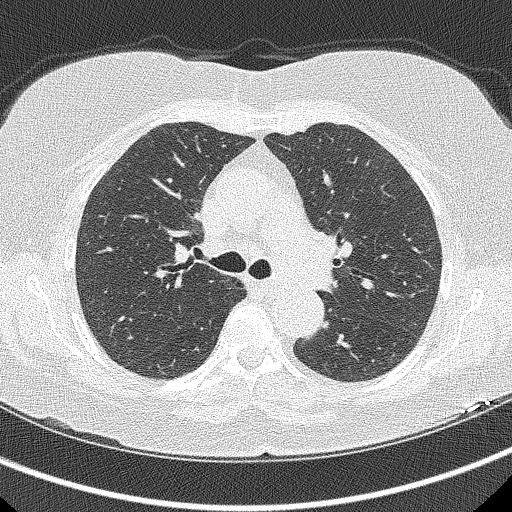
[im 237/326  mediastinal]
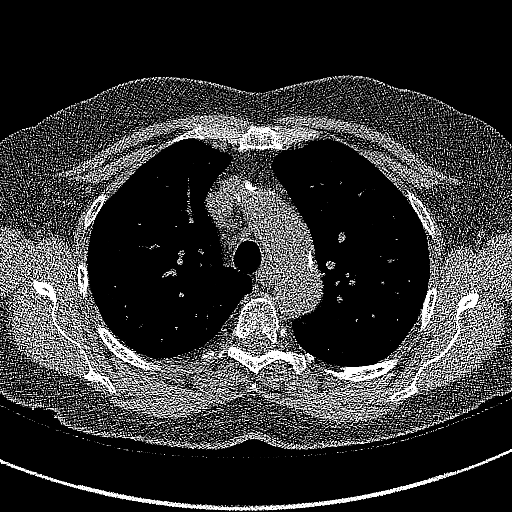
[im 237/326  lung]
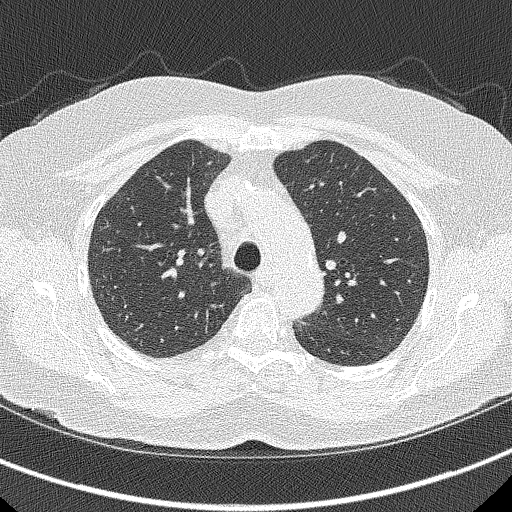
[im 252/326  lung]
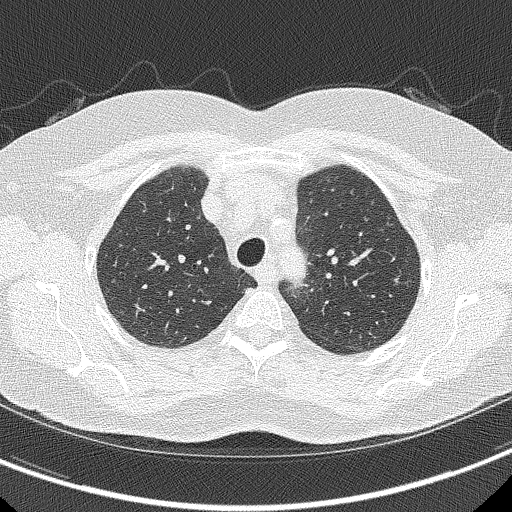
[im 281/326  lung]
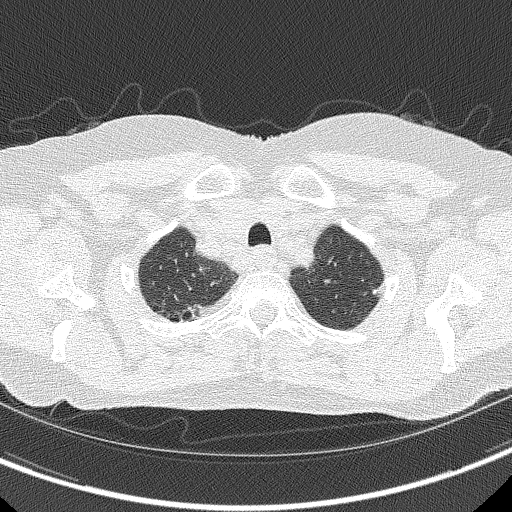
[im 311/326  lung]
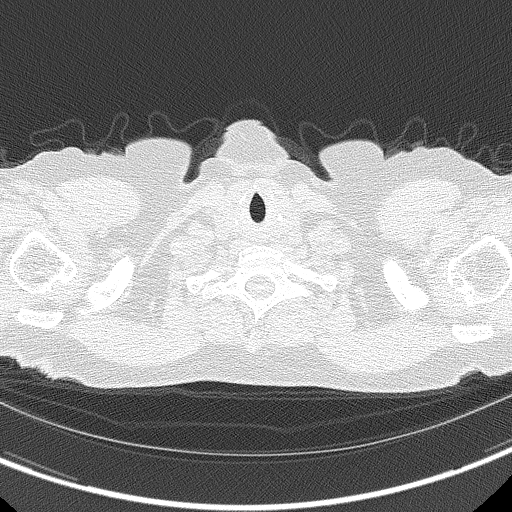

[Series 5: coronals lung 1.00 cor · coronal · 0.57mm/px · 3 of 243 slices shown]
[im 49/243  lung]
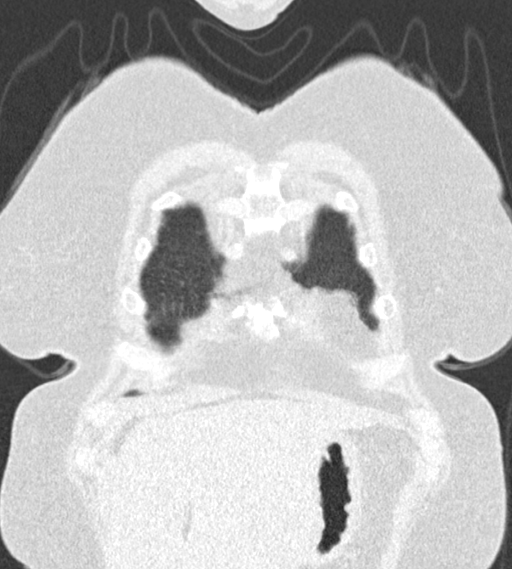
[im 97/243  lung]
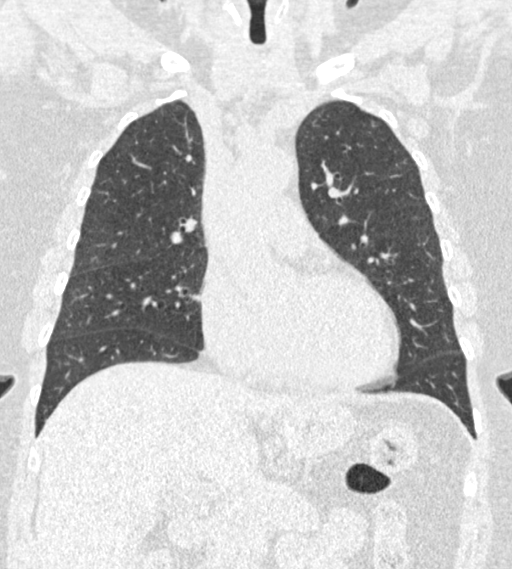
[im 146/243  lung]
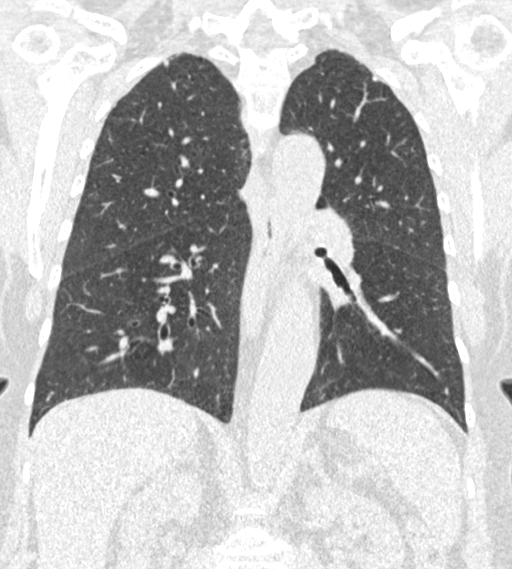

[15 of 40 positions shown; findings below may reference images not displayed]

FINDINGS: Cardiovascular: Atherosclerotic calcification of the aorta, aortic
valve and coronary arteries. Heart size normal. No pericardial
effusion.

Mediastinum/Nodes: No pathologically enlarged mediastinal or
axillary lymph nodes. Hilar regions are difficult to definitively
evaluate without IV contrast but appear grossly unremarkable.
Esophagus is grossly unremarkable.

Lungs/Pleura: Centrilobular and paraseptal emphysema. Pulmonary
nodules measure 1.7 mm or less in size. No suspicious pulmonary
nodules. No pleural fluid. Airway is unremarkable.

Upper Abdomen: Visualized portions of the liver, gallbladder,
adrenal glands, kidneys, spleen, pancreas, stomach and bowel are
grossly unremarkable.

Musculoskeletal: No worrisome lytic or sclerotic lesions.
IMPRESSION: 1. Lung-RADS 2, benign appearance or behavior. Continue annual
screening with low-dose chest CT without contrast in 12 months.
2. Aortic atherosclerosis ([id number]-I96.6). Coronary artery
calcification.
3.  Emphysema ([id number]).

## 2023-01-02 ENCOUNTER — Other Ambulatory Visit: Payer: Self-pay | Admitting: Acute Care

## 2023-01-02 DIAGNOSIS — Z122 Encounter for screening for malignant neoplasm of respiratory organs: Secondary | ICD-10-CM

## 2023-01-02 DIAGNOSIS — F17210 Nicotine dependence, cigarettes, uncomplicated: Secondary | ICD-10-CM

## 2023-01-02 DIAGNOSIS — Z87891 Personal history of nicotine dependence: Secondary | ICD-10-CM

## 2023-01-19 ENCOUNTER — Telehealth: Payer: Self-pay | Admitting: Nurse Practitioner

## 2023-01-19 DIAGNOSIS — Z1211 Encounter for screening for malignant neoplasm of colon: Secondary | ICD-10-CM

## 2023-01-19 NOTE — Telephone Encounter (Signed)
Copied from CRM [id number]. Topic: General - Other >> Jan 19, 2023  2:36 PM Turkey B wrote: Reason for CRM: Andie from [employer], called in for pt wanting colin screening kit sent to her. Please call back pt

## 2023-01-20 NOTE — Telephone Encounter (Signed)
Patient would like to have a cologuard sent to her

## 2023-01-20 NOTE — Addendum Note (Signed)
Addended by: Aura Dials T on: 01/20/2023 10:23 AM   Modules accepted: Orders

## 2023-02-01 ENCOUNTER — Other Ambulatory Visit: Payer: Self-pay | Admitting: Nurse Practitioner

## 2023-02-01 NOTE — Telephone Encounter (Signed)
Requested medication (s) are due for refill today - provider review   Requested medication (s) are on the active medication list - yes  Future visit scheduled -yes  Last refill: 11/23/21 #12 4RF  Notes to clinic: high dose medication- provider review   Requested Prescriptions  Pending Prescriptions Disp Refills   Cholecalciferol (VITAMIN D3) 1.25 MG (50000 UT) CAPS [Pharmacy Med Name: VITAMIN D3 50,000 UNIT CAPSULE] 12 capsule 4    Sig: TAKE 1 TABLET BY MOUTH ONE TIME PER WEEK     Endocrinology:  Vitamins - Vitamin D Supplementation 2 Failed - 02/01/2023  2:29 AM      Failed - Manual Review: Route requests for 50,000 IU strength to the provider      Passed - Ca in normal range and within 360 days    Calcium  Date Value Ref Range Status  12/05/2022 9.4 8.7 - 10.3 mg/dL Final         Passed - Vitamin D in normal range and within 360 days    Vit D, 25-Hydroxy  Date Value Ref Range Status  05/25/2022 59.9 30.0 - 100.0 ng/mL Final    Comment:    Vitamin D deficiency has been defined by the Institute of Medicine and an Endocrine Society practice guideline as a level of serum 25-OH vitamin D less than 20 ng/mL (1,2). The Endocrine Society went on to further define vitamin D insufficiency as a level between 21 and 29 ng/mL (2). 1. IOM (Institute of Medicine). 2010. Dietary reference    intakes for calcium and D. [place]: The    [employer]. 2. Holick MF, Binkley Ahmeek, Bischoff-Ferrari HA, et al.    Evaluation, treatment, and prevention of vitamin D    deficiency: an Endocrine Society clinical practice    guideline. JCEM. 2011 Jul; 96(7):1911-30.          Passed - Valid encounter within last 12 months    Recent Outpatient Visits           1 month ago Centrilobular emphysema (HCC)   [place] [facility] [place], Corrie Dandy T, NP   8 months ago Centrilobular emphysema (HCC)   [place] [facility] [place], Corrie Dandy T, NP   1 year ago  Centrilobular emphysema (HCC)   [place] [facility] [place], Corrie Dandy T, NP   1 year ago Centrilobular emphysema (HCC)   [place] [facility] [place], Corrie Dandy T, NP   2 years ago Centrilobular emphysema (HCC)   [place] [facility] [place], Dorie Rank, NP       Future Appointments             In 3 months Cannady, Dorie Rank, NP [place] [facility]               Requested Prescriptions  Pending Prescriptions Disp Refills   Cholecalciferol (VITAMIN D3) 1.25 MG (50000 UT) CAPS [Pharmacy Med Name: VITAMIN D3 50,000 UNIT CAPSULE] 12 capsule 4    Sig: TAKE 1 TABLET BY MOUTH ONE TIME PER WEEK     Endocrinology:  Vitamins - Vitamin D Supplementation 2 Failed - 02/01/2023  2:29 AM      Failed - Manual Review: Route requests for 50,000 IU strength to the provider      Passed - Ca in normal range and within 360 days    Calcium  Date Value Ref Range Status  12/05/2022 9.4 8.7 - 10.3 mg/dL Final  Passed - Vitamin D in normal range and within 360 days    Vit D, 25-Hydroxy  Date Value Ref Range Status  05/25/2022 59.9 30.0 - 100.0 ng/mL Final    Comment:    Vitamin D deficiency has been defined by the Institute of Medicine and an Endocrine Society practice guideline as a level of serum 25-OH vitamin D less than 20 ng/mL (1,2). The Endocrine Society went on to further define vitamin D insufficiency as a level between 21 and 29 ng/mL (2). 1. IOM (Institute of Medicine). 2010. Dietary reference    intakes for calcium and D. [place]: The    [employer]. 2. Holick MF, Binkley [place], Bischoff-Ferrari HA, et al.    Evaluation, treatment, and prevention of vitamin D    deficiency: an Endocrine Society clinical practice    guideline. JCEM. 2011 Jul; 96(7):1911-30.          Passed - Valid encounter within last 12 months    Recent Outpatient Visits           1 month ago Centrilobular emphysema  (HCC)   [place] [facility] [place], Corrie Dandy T, NP   8 months ago Centrilobular emphysema (HCC)   [place] [facility] [place], Corrie Dandy T, NP   1 year ago Centrilobular emphysema (HCC)   [place] [facility] [place], Corrie Dandy T, NP   1 year ago Centrilobular emphysema (HCC)   [place] [facility] [place], Corrie Dandy T, NP   2 years ago Centrilobular emphysema (HCC)    [facility] [place], Dorie Rank, NP       Future Appointments             In 3 months Cannady, Dorie Rank, NP  [facility]

## 2023-02-10 DIAGNOSIS — Z1211 Encounter for screening for malignant neoplasm of colon: Secondary | ICD-10-CM | POA: Diagnosis not present

## 2023-02-16 ENCOUNTER — Telehealth: Payer: Self-pay | Admitting: *Deleted

## 2023-02-16 LAB — COLOGUARD: COLOGUARD: NEGATIVE

## 2023-02-16 NOTE — Progress Notes (Signed)
Good morning, please let Savannah Whitaker know her Cologuard is negative, repeat in 3 years and that will be last check:)

## 2023-02-16 NOTE — Telephone Encounter (Signed)
Patient returned call and notified:  Good morning, please let Savannah Whitaker know her Cologuard is negative, repeat in 3 years and that will be last check:)

## 2023-05-28 NOTE — Patient Instructions (Incomplete)
Be Involved in Caring For Your Health:  Taking Medications When medications are taken as directed, they can greatly improve your health. But if they are not taken as prescribed, they may not work. In some cases, not taking them correctly can be harmful. To help ensure your treatment remains effective and safe, understand your medications and how to take them. Bring your medications to each visit for review by your provider.  Your lab results, notes, and after visit summary will be available on My Chart. We strongly encourage you to use this feature. If lab results are abnormal the clinic will contact you with the appropriate steps. If the clinic does not contact you assume the results are satisfactory. You can always view your results on My Chart. If you have questions regarding your health or results, please contact the clinic during office hours. You can also ask questions on My Chart.  We at [facility] are grateful that you chose [place] to provide your care. We strive to provide evidence-based and compassionate care and are always looking for feedback. If you get a survey from the clinic please complete this so we can hear your opinions.  Living with COPD Being diagnosed with chronic obstructive pulmonary disease (COPD) changes your life physically and emotionally. Having COPD can affect your ability to work and do things you enjoy. COPD is not the same for everyone, and it may change over time. Your health care providers can help you come up with the COPD management plan that works best for you. How to manage lifestyle changes Treatment plan Work closely with your health care providers. Follow your COPD management plan. This plan includes: Instructions about activities, exercises, diet, medicines, what to do when COPD flares up, and when to call your health care provider. A pulmonary rehabilitation program. In pulmonary rehab, you will learn about COPD, do exercises for fitness and  breathing, and get support from health care providers and other people who have COPD. Managing emotions and stress Living with a chronic disease means you may also struggle with stressful emotions, such as sadness, fear, and worry. Here are some ways to manage these emotions: Talk to someone about your fear, anxiety, depression, or stress. Learn strategies to avoid or reduce stress and ask for help if you are struggling with depression or anxiety. Consider joining a COPD support group, online or in person.  Adjusting to changes COPD may limit the things you can do, but you can make certain changes to help you cope with the diagnosis. Ask for help when you need it. Getting support from friends, family, and your health care team is an important part of managing the condition. Try to get regular exercise as prescribed by a health care provider or pulmonary rehab team. Exercising can help COPD, even if you are a bit short of breath. Take steps to prevent infection and protect your lungs: Wash your hands often and avoid being in crowds. Stay away from friends and family members who are sick. Check your local air quality each day, and stay out of areas where air pollution is likely. How to recognize changes in your condition Recognizing changes in your COPD COPD is a progressive disease. It is important to let the health care team know if your COPD is getting worse. Your treatment plan may need to change. Watch for: Increased shortness of breath, wheezing, cough, or fatigue. Loss of ability to exercise or perform daily activities, like climbing stairs. More frequent symptom flares. Signs  of depression or anxiety. Recognizing stress It is normal to have additional stress when you have COPD. However, prolonged stress and anxiety can make COPD worse and lead to depression. Recognize the warning signs, which include: Feeling sad or worried more often or most of the time. Having less energy and losing  interest in pleasurable activities. Changes in your appetite or sleeping patterns. Being easily angered or irritated. Having unexplained aches and pains, digestive problems, or headaches. Follow these instructions at home: Eating and drinking  Eat foods that are high in fiber, such as fresh fruits and vegetables, whole grains, and beans. Limit foods that are high in fat and processed sugars, such as fried or sweet foods. Follow a balanced diet and maintain a healthy weight. Being overweight or underweight can make COPD worse. You may work with a Data processing manager as part of your pulmonary rehab program. Drink enough fluid to keep your urine pale yellow. If you drink alcohol: Limit how much you have to: 0-1 drink a day for women who are not pregnant. 0-2 drinks a day for men. Know how much alcohol is in your drink. In the [place] one drink equals one 12 oz bottle of beer (355 mL), one 5 oz glass of wine (148 mL), or one 1 oz glass of hard liquor (44 mL). Lifestyle If you smoke, the most important thing that you can do is to stop smoking. Continuing to smoke will cause the disease to progress faster. Do not use any products that contain nicotine or tobacco. These products include cigarettes, chewing tobacco, and vaping devices, such as e-cigarettes. If you need help quitting, ask your health care provider. Avoid exposure to things that irritate your lungs, such as smoke, chemicals, and fumes. Activity Balance exercise and rest. Take short walks every 1-2 hours. This is important to improve blood flow and breathing. Ask for help if you feel weak or unsteady. Do exercises that include controlled breathing with body movement, such as tai chi. General instructions Take over-the-counter and prescription medicines only as told by your health care provider. Take vitamin and protein supplements as told by your health care provider or dietitian. Practice good oral hygiene and see your dental care provider  regularly. An oral infection can also spread to your lungs. Make sure you receive all the vaccines that your health care provider recommends. Keep all follow-up visits. This is important. Contact a health care provider if you: Are struggling to manage your COPD. Have emotional stress that interferes with your ability to cope with COPD. Get help right away if you: Have thoughts of suicide, death, or hurting yourself or others. If you ever feel like you may hurt yourself or others, or have thoughts about taking your own life, get help right away. Go to your nearest emergency department or: Call your local emergency services (911 in the [place] Call a suicide crisis helpline, such as the National Suicide Prevention Lifeline at 831-272-4408 or 988 in the [place] This is open 24 hours a day in the [place] Text the Crisis Text Line at (949) 528-7446 (in the [place] Summary Being diagnosed with chronic obstructive pulmonary disease (COPD) changes your life physically and emotionally. Work with your health care providers and follow your COPD management plan. A pulmonary rehabilitation program is an important part of COPD management. Prolonged stress, anxiety, and depression can make COPD worse. Let your health care provider know if emotional stress interferes with your ability to cope with and manage COPD. This information is not intended to  replace advice given to you by your health care provider. Make sure you discuss any questions you have with your health care provider. Document Revised: 02/10/2021 Document Reviewed: 08/05/2020 Elsevier Patient Education  2024 [employer].

## 2023-05-30 ENCOUNTER — Encounter: Payer: Self-pay | Admitting: Nurse Practitioner

## 2023-05-30 ENCOUNTER — Ambulatory Visit (INDEPENDENT_AMBULATORY_CARE_PROVIDER_SITE_OTHER): Payer: Medicare HMO | Admitting: Nurse Practitioner

## 2023-05-30 VITALS — BP 106/61 | HR 68 | Temp 98.2°F | Ht <= 58 in | Wt 125.6 lb

## 2023-05-30 DIAGNOSIS — I700 Atherosclerosis of aorta: Secondary | ICD-10-CM | POA: Diagnosis not present

## 2023-05-30 DIAGNOSIS — Z0000 Encounter for general adult medical examination without abnormal findings: Secondary | ICD-10-CM | POA: Diagnosis not present

## 2023-05-30 DIAGNOSIS — F419 Anxiety disorder, unspecified: Secondary | ICD-10-CM

## 2023-05-30 DIAGNOSIS — Z23 Encounter for immunization: Secondary | ICD-10-CM

## 2023-05-30 DIAGNOSIS — E782 Mixed hyperlipidemia: Secondary | ICD-10-CM

## 2023-05-30 DIAGNOSIS — F17219 Nicotine dependence, cigarettes, with unspecified nicotine-induced disorders: Secondary | ICD-10-CM | POA: Diagnosis not present

## 2023-05-30 DIAGNOSIS — I10 Essential (primary) hypertension: Secondary | ICD-10-CM | POA: Diagnosis not present

## 2023-05-30 DIAGNOSIS — J432 Centrilobular emphysema: Secondary | ICD-10-CM

## 2023-05-30 DIAGNOSIS — E559 Vitamin D deficiency, unspecified: Secondary | ICD-10-CM

## 2023-05-30 DIAGNOSIS — E063 Autoimmune thyroiditis: Secondary | ICD-10-CM

## 2023-05-30 MED ORDER — ALBUTEROL SULFATE HFA 108 (90 BASE) MCG/ACT IN AERS: 2.0000 | INHALATION_SPRAY | Freq: Four times a day (QID) | RESPIRATORY_TRACT | 4 refills | Status: AC | PRN

## 2023-05-30 MED ORDER — AMOXICILLIN-POT CLAVULANATE 875-125 MG PO TABS: 1.0000 | ORAL_TABLET | Freq: Two times a day (BID) | ORAL | 0 refills | Status: AC

## 2023-05-30 NOTE — Assessment & Plan Note (Signed)
Chronic, ongoing.  Continue current medication regimen and adjust as needed.  Thyroid labs today. 

## 2023-05-30 NOTE — Assessment & Plan Note (Signed)
Chronic, stable. Denies SI/HI.  Continue current medication regimen and adjust as needed. Would ultimately like to utilize Wellbutrin to assist with depressive elements & smoking cessation, but at this time is on Metoprolol and will avoid this.  

## 2023-05-30 NOTE — Assessment & Plan Note (Addendum)
Chronic, ongoing with no inhalers.  FEV1 54%, FEV1/FVC 66% in May 2024. Recommend complete cessation of smoking and continue yearly CT screening.  Plan on monitoring lung function annually.  Recommended starting Anoro but she wishes to think about this and refuses today.  She will alert provider if wishes to trial.  Discussed with her that emphysema is a progressive disease and there are ways to slow down progression -- quitting smoking and using inhalers. Albuterol inhaler sent in to use as needed, especially while sick, for SOB.  Currently with cough for over one week, will send in Augmentin to treat.  Wishes to keep medication to minimum.  Aware to return if cough does not improve.

## 2023-05-30 NOTE — Assessment & Plan Note (Signed)
Chronic, ongoing.  Continue Rosuvastatin and recheck lipid today (fasting).  Adjust as needed. Triglycerides in past elevated, was not fasting.  If continued elevation consider addition of Fenofibrate to regimen.

## 2023-05-30 NOTE — Assessment & Plan Note (Signed)
Chronic, stable.  BP well below goal in office today. Continue Metoprolol and Amlodipine and adjust as needed.  Recommend she monitor BP at least a few mornings a week at home and document.  DASH diet at home.  Labs today: CBC, CMP, TSH.  Urine ALB 80 May 2024.  Return in 6 months.

## 2023-05-30 NOTE — Assessment & Plan Note (Signed)
Chronic, noted on lung cancer screening imaging.  Continue daily statin and recommend complete cessation of smoking.  Recommend daily Baby ASA 81 MG.  If symptoms present will refer to cardiology.

## 2023-05-30 NOTE — Assessment & Plan Note (Signed)
Ongoing, taking supplement, recheck Vit D today.  Recommend change to daily Vitamin D 3 2000 units.

## 2023-05-30 NOTE — Progress Notes (Signed)
BP 106/61   Pulse 68   Temp 98.2 F (36.8 C) (Oral)   Ht 4' 9.4" (1.458 m)   Wt 125 lb 9.6 oz (57 kg)   LMP  (LMP Unknown)   SpO2 96%   BMI 26.80 kg/m    Subjective:    Patient ID: Savannah Whitaker, female    DOB: August 02, 1950, 72 y.o.   MRN: [id number]  HPI: Roaa Attig is a 72 y.o. female presenting on 05/30/2023 for comprehensive medical examination. Current medical complaints include:none  She currently lives with: self Menopausal Symptoms: no   COPD No current inhaler regimen. Currently smokes 1 PPD, has been smoking since she was 24.  Not interested in quitting.  Last lung cancer screening was 12/27/22 noting ongoing mild emphysema and aortic atherosclerosis.   COPD status: stable Satisfied with current treatment?: yes Oxygen use: no Dyspnea frequency: none Cough frequency: chronic, at baseline Rescue inhaler frequency: none Limitation of activity: no Productive cough: has had a productive cough for over one week now, started out with congestion and sneezing Last Spirometry: 12/05/22 Pneumovax:  refused Influenza: Up to Date  HYPERTENSION / HYPERLIPIDEMIA Taking Rosuvastatin, Amlodipine, and Metoprolol. Not to have two-vessel coronary atherosclerosis on past imaging.. Satisfied with current treatment? yes Duration of hypertension: chronic BP monitoring frequency: not checking BP range:  BP medication side effects: no Duration of hyperlipidemia: chronic Cholesterol medication side effects: no Cholesterol supplements: none Medication compliance: good compliance Aspirin: no Recent stressors: no Recurrent headaches: no Visual changes: no Palpitations: no Dyspnea: no Chest pain: no Lower extremity edema: no Dizzy/lightheaded: no   HYPOTHYROIDISM Continues on Levothyroxine 75 MCG daily.  Is taking Vitamin D supplement at home for history of low levels.  Has refused DEXA. Thyroid control status:stable Satisfied with current treatment?  yes Medication side effects: no Medication compliance: good compliance Etiology of hypothyroidism:  Recent dose adjustment:no Fatigue: no Cold intolerance: no Heat intolerance: no Weight gain: no Weight loss: no Constipation: no Diarrhea/loose stools: no Palpitations: no Lower extremity edema: no Anxiety/depressed mood: no   ANXIETY/STRESS Continues on Citalopram 20 MG daily. Started after her husband passed away over 3 years ago. Duration: stable Anxious mood: yes  Excessive worrying: no Irritability: no  Sweating: no Nausea: no Palpitations:no Hyperventilation: no Panic attacks: no Agoraphobia: no  Obscessions/compulsions: no Depressed mood: no    05/30/2023    1:10 PM 12/05/2022    2:02 PM 11/01/2022   11:30 AM 05/25/2022    9:08 AM 11/22/2021    8:29 AM  Depression screen PHQ 2/9  Decreased Interest 0 0 0 0 2  Down, Depressed, Hopeless 0 0 0 0 1  PHQ - 2 Score 0 0 0 0 3  Altered sleeping 0 1 0 0 2  Tired, decreased energy 0 1 0 0 1  Change in appetite 0 0 0 0 0  Feeling bad or failure about yourself  0 0 0 0 0  Trouble concentrating 0 0 0 0 0  Moving slowly or fidgety/restless 0 0 0 0 0  Suicidal thoughts 0 0 0 0 0  PHQ-9 Score 0 2 0 0 6  Difficult doing work/chores Not difficult at all Not difficult at all Not difficult at all Not difficult at all Somewhat difficult  Anhedonia: no Weight changes: no Insomnia: no  none   Hypersomnia: no Fatigue/loss of energy: no Feelings of worthlessness: no Feelings of guilt: no Impaired concentration/indecisiveness: no Suicidal ideations: no  Crying spells: no Recent Stressors/Life Changes: no  Relationship problems: no   Family stress: no     Financial stress: no    Job stress: no    Recent death/loss: no    June 02, 2023    1:10 PM 12/05/2022    2:02 PM 05/25/2022    9:08 AM 11/22/2021    8:30 AM  GAD 7 : Generalized Anxiety Score  Nervous, Anxious, on Edge 0 0 0 0  Control/stop worrying 0 0 0 1  Worry too  much - different things 0 0 0 0  Trouble relaxing 0 0 0 1  Restless 0 0 0 0  Easily annoyed or irritable 0 0 0 0  Afraid - awful might happen 0 0 0 0  Total GAD 7 Score 0 0 0 2  Anxiety Difficulty Not difficult at all Not difficult at all Not difficult at all Not difficult at all      June 02, 2023    1:10 PM 12/05/2022    2:02 PM 11/01/2022   11:32 AM 05/25/2022    9:07 AM 09/30/2021   11:25 AM  Fall Risk   Falls in the past year? 0 1 0 0 0  Number falls in past yr: 0 1 0 0 0  Injury with Fall? 0 0 0 0 0  Risk for fall due to : No Fall Risks History of fall(s) No Fall Risks No Fall Risks   Follow up Falls evaluation completed  Falls prevention discussed;Falls evaluation completed Falls evaluation completed Falls evaluation completed;Falls prevention discussed    Functional Status Survey: Is the patient deaf or have difficulty hearing?: No Does the patient have difficulty seeing, even when wearing glasses/contacts?: No Does the patient have difficulty concentrating, remembering, or making decisions?: No Does the patient have difficulty walking or climbing stairs?: No Does the patient have difficulty dressing or bathing?: No Does the patient have difficulty doing errands alone such as visiting a doctor's office or shopping?: No    Past Medical History:  Past Medical History:  Diagnosis Date   Anxiety    Hyperlipidemia    Hypertension    Hypertriglyceridemia    IFG (impaired fasting glucose)    Osteoarthritis    Thyroid disease    Tobacco use disorder    Vitamin D deficiency     Surgical History:  Past Surgical History:  Procedure Laterality Date   CARPAL TUNNEL RELEASE Right    TUBAL LIGATION      Medications:  Current Outpatient Medications on File Prior to Visit  Medication Sig   amLODipine (NORVASC) 2.5 MG tablet Take 1 tablet (2.5 mg total) by mouth daily.   Cholecalciferol (VITAMIN D3) 1.25 MG (50000 UT) CAPS TAKE 1 TABLET BY MOUTH ONE TIME PER WEEK   citalopram  (CELEXA) 20 MG tablet Take 1 tablet (20 mg total) by mouth daily.   levothyroxine (SYNTHROID) 75 MCG tablet Take 1 tablet (75 mcg total) by mouth daily.   metoprolol succinate (TOPROL-XL) 50 MG 24 hr tablet Take with or immediately following a meal.   rosuvastatin (CRESTOR) 40 MG tablet Take 1 tablet (40 mg total) by mouth daily.   No current facility-administered medications on file prior to visit.    Allergies:  No Known Allergies  Social History:  Social History   Socioeconomic History   Marital status: Widowed    Spouse name: Not on file   Number of children: Not on file   Years of education: Not on file   Highest education level: High school graduate  Occupational History  Occupation: semi retired  Tobacco Use   Smoking status: Every Day    Current packs/day: 1.00    Average packs/day: 1 pack/day for 40.0 years (40.0 ttl pk-yrs)    Types: Cigarettes   Smokeless tobacco: Never  Vaping Use   Vaping status: Never Used  Substance and Sexual Activity   Alcohol use: Yes    Alcohol/week: 0.0 standard drinks of alcohol    Comment: occasional wine.    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    Social Determinants of Health   Financial Resource Strain: Low Risk  (11/01/2022)   Overall Financial Resource Strain (CARDIA)    Difficulty of Paying Living Expenses: Not hard at all  Food Insecurity: No Food Insecurity (11/01/2022)   Hunger Vital Sign    Worried About Running Out of Food in the Last Year: Never true    Ran Out of Food in the Last Year: Never true  Transportation Needs: No Transportation Needs (11/01/2022)   PRAPARE - Administrator, Civil Service (Medical): No    Lack of Transportation (Non-Medical): No  Physical Activity: Insufficiently Active (11/01/2022)   Exercise Vital Sign    Days of Exercise per Week: 2 days     Minutes of Exercise per Session: 20 min  Stress: No Stress Concern Present (11/01/2022)   [employer] of Occupational Health - Occupational Stress Questionnaire    Feeling of Stress : Only a little  Social Connections: Moderately Isolated (11/01/2022)   Social Connection and Isolation Panel [NHANES]    Frequency of Communication with Friends and Family: More than three times a week    Frequency of Social Gatherings with Friends and Family: More than three times a week    Attends Religious Services: More than 4 times per year    Active Member of [employer] or Organizations: No    Attends Banker Meetings: Never    Marital Status: Widowed  Intimate Partner Violence: Not At Risk (11/01/2022)   Humiliation, Afraid, Rape, and Kick questionnaire    Fear of Current or Ex-Partner: No    Emotionally Abused: No    Physically Abused: No    Sexually Abused: No   Social History   Tobacco Use  Smoking Status Every Day   Current packs/day: 1.00   Average packs/day: 1 pack/day for 40.0 years (40.0 ttl pk-yrs)   Types: Cigarettes  Smokeless Tobacco Never   Social History   Substance and Sexual Activity  Alcohol Use Yes   Alcohol/week: 0.0 standard drinks of alcohol   Comment: occasional wine.     Family History:  Family History  Problem Relation Age of Onset   Lung cancer Mother    Heart disease Father    Colon cancer Brother    Alcohol abuse Brother    Cancer Brother        mouth   Breast cancer Neg Hx     Past medical history, surgical history, medications, allergies, family history and social history reviewed with patient today and changes made to appropriate areas of the chart.   Review of Systems - negative All other ROS negative except what is listed above and in the HPI.      Objective:    BP 106/61   Pulse 68   Temp 98.2 F (36.8 C) (Oral)  Ht 4' 9.4" (1.458 m)   Wt 125 lb 9.6 oz (57 kg)   LMP  (LMP Unknown)   SpO2 96%   BMI 26.80 kg/m   Wt Readings  from Last 3 Encounters:  05/30/23 125 lb 9.6 oz (57 kg)  12/05/22 131 lb 8 oz (59.6 kg)  11/01/22 133 lb (60.3 kg)    Physical Exam Vitals and nursing note reviewed.  Constitutional:      General: She is awake. She is not in acute distress.    Appearance: She is well-developed and well-groomed. She is not ill-appearing or toxic-appearing.  HENT:     Head: Normocephalic and atraumatic.     Right Ear: Hearing, tympanic membrane, ear canal and external ear normal. No drainage.     Left Ear: Hearing, tympanic membrane, ear canal and external ear normal. No drainage.     Nose: Nose normal.     Right Sinus: No maxillary sinus tenderness or frontal sinus tenderness.     Left Sinus: No maxillary sinus tenderness or frontal sinus tenderness.     Mouth/Throat:     Mouth: Mucous membranes are moist.     Pharynx: Oropharynx is clear. Uvula midline. No pharyngeal swelling, oropharyngeal exudate or posterior oropharyngeal erythema.  Eyes:     General: Lids are normal.        Right eye: No discharge.        Left eye: No discharge.     Extraocular Movements: Extraocular movements intact.     Conjunctiva/sclera: Conjunctivae normal.     Pupils: Pupils are equal, round, and reactive to light.     Visual Fields: Right eye visual fields normal and left eye visual fields normal.  Neck:     Thyroid: No thyromegaly.     Vascular: No carotid bruit.     Trachea: Trachea normal.  Cardiovascular:     Rate and Rhythm: Normal rate and regular rhythm.     Heart sounds: Normal heart sounds. No murmur heard.    No gallop.  Pulmonary:     Effort: Pulmonary effort is normal. No accessory muscle usage or respiratory distress.     Breath sounds: Wheezing present. No decreased breath sounds or rhonchi.     Comments: Expiratory wheezes noted throughout.  No rhonchi.  Intermittent cough noted. Chest:     Comments: Refuses breast exam today. Abdominal:     General: Bowel sounds are normal.     Palpations:  Abdomen is soft. There is no hepatomegaly or splenomegaly.     Tenderness: There is no abdominal tenderness.  Musculoskeletal:        General: Normal range of motion.     Cervical back: Normal range of motion and neck supple.     Right lower leg: No edema.     Left lower leg: No edema.  Lymphadenopathy:     Head:     Right side of head: No submental, submandibular, tonsillar, preauricular or posterior auricular adenopathy.     Left side of head: No submental, submandibular, tonsillar, preauricular or posterior auricular adenopathy.     Cervical: No cervical adenopathy.     Upper Body:     Right upper body: No supraclavicular, axillary or pectoral adenopathy.     Left upper body: No supraclavicular, axillary or pectoral adenopathy.  Skin:    General: Skin is warm and dry.     Capillary Refill: Capillary refill takes less than 2 seconds.     Findings: No rash.  Neurological:  Mental Status: She is alert and oriented to person, place, and time.     Gait: Gait is intact.     Deep Tendon Reflexes: Reflexes are normal and symmetric.     Reflex Scores:      Brachioradialis reflexes are 2+ on the right side and 2+ on the left side.      Patellar reflexes are 2+ on the right side and 2+ on the left side. Psychiatric:        Attention and Perception: Attention normal.        Mood and Affect: Mood normal.        Speech: Speech normal.        Behavior: Behavior normal. Behavior is cooperative.        Thought Content: Thought content normal.        Judgment: Judgment normal.    Results for orders placed or performed in visit on 01/19/23  Cologuard  Result Value Ref Range   COLOGUARD Negative Negative      Assessment & Plan:   Problem List Items Addressed This Visit       Cardiovascular and Mediastinum   Aortic atherosclerosis (HCC)    Chronic, noted on lung cancer screening imaging.  Continue daily statin and recommend complete cessation of smoking.  Recommend daily Baby ASA 81  MG.  If symptoms present will refer to cardiology.      Hypertension    Chronic, stable.  BP well below goal in office today. Continue Metoprolol and Amlodipine and adjust as needed.  Recommend she monitor BP at least a few mornings a week at home and document.  DASH diet at home.  Labs today: CBC, CMP, TSH.  Urine ALB 80 May 2024.  Return in 6 months.       Relevant Orders   CBC with Differential/Platelet   Comprehensive metabolic panel     Respiratory   Centrilobular emphysema (HCC) - Primary    Chronic, ongoing with no inhalers.  FEV1 54%, FEV1/FVC 66% in May 2024. Recommend complete cessation of smoking and continue yearly CT screening.  Plan on monitoring lung function annually.  Recommended starting Anoro but she wishes to think about this and refuses today.  She will alert provider if wishes to trial.  Discussed with her that emphysema is a progressive disease and there are ways to slow down progression -- quitting smoking and using inhalers. Albuterol inhaler sent in to use as needed, especially while sick, for SOB.  Currently with cough for over one week, will send in Augmentin to treat.  Wishes to keep medication to minimum.  Aware to return if cough does not improve.      Relevant Medications   albuterol (VENTOLIN HFA) 108 (90 Base) MCG/ACT inhaler   Other Relevant Orders   CBC with Differential/Platelet     Endocrine   Hashimoto's thyroiditis    Chronic, ongoing.  Continue current medication regimen and adjust as needed.  Thyroid labs today.      Relevant Orders   TSH   T4, free     Nervous and Auditory   Nicotine dependence, cigarettes, w unsp disorders    I have recommended complete cessation of tobacco use. I have discussed various options available for assistance with tobacco cessation including over the counter methods (Nicotine gum, patch and lozenges). We also discussed prescription options (Chantix, Nicotine Inhaler / Nasal Spray). The patient is not interested in  pursuing any prescription tobacco cessation options at this time.  Other   Chronic anxiety    Chronic, stable. Denies SI/HI.  Continue current medication regimen and adjust as needed. Would ultimately like to utilize Wellbutrin to assist with depressive elements & smoking cessation, but at this time is on Metoprolol and will avoid this.       Hyperlipidemia    Chronic, ongoing.  Continue Rosuvastatin and recheck lipid today (fasting).  Adjust as needed. Triglycerides in past elevated, was not fasting.  If continued elevation consider addition of Fenofibrate to regimen.      Relevant Orders   Comprehensive metabolic panel   Lipid Panel w/o Chol/HDL Ratio   Vitamin D deficiency    Ongoing, taking supplement, recheck Vit D today.  Recommend change to daily Vitamin D 3 2000 units.      Relevant Orders   VITAMIN D 25 Hydroxy (Vit-D Deficiency, Fractures)   Other Visit Diagnoses     Flu vaccine need       Flu vaccine given today, educated on this.   Relevant Orders   Flu Vaccine Trivalent High Dose (Fluad) (Completed)   Encounter for annual physical exam       Annual physical today with labs and health maintenance reviewed, discussed with patient.        Follow up plan: Return in about 6 months (around 11/28/2023) for COPD, HTN/HLD, ANXIETY.   LABORATORY TESTING:  - Pap smear: not applicable  IMMUNIZATIONS:   - Tdap: Tetanus vaccination status reviewed: refuses, have educated - Influenza: Provided today - Pneumovax: Agrees to PCV20 next visit, does not want today - Prevnar: Refused - HPV: Not applicable - Zostavax vaccine: Refused  SCREENING: -Mammogram: Refused  - Colonoscopy: Refused  - Bone Density: Refused  -Hearing Test: Not applicable  -Spirometry: Not applicable   PATIENT COUNSELING:   Advised to take 1 mg of folate supplement per day if capable of pregnancy.   Sexuality: Discussed sexually transmitted diseases, partner selection, use of condoms,  avoidance of unintended pregnancy  and contraceptive alternatives.   Advised to avoid cigarette smoking.  I discussed with the patient that most people either abstain from alcohol or drink within safe limits (<=14/week and <=4 drinks/occasion for males, <=7/weeks and <= 3 drinks/occasion for females) and that the risk for alcohol disorders and other health effects rises proportionally with the number of drinks per week and how often a drinker exceeds daily limits.  Discussed cessation/primary prevention of drug use and availability of treatment for abuse.   Diet: Encouraged to adjust caloric intake to maintain  or achieve ideal body weight, to reduce intake of dietary saturated fat and total fat, to limit sodium intake by avoiding high sodium foods and not adding table salt, and to maintain adequate dietary potassium and calcium preferably from fresh fruits, vegetables, and low-fat dairy products.    Stressed the importance of regular exercise  Injury prevention: Discussed safety belts, safety helmets, smoke detector, smoking near bedding or upholstery.   Dental health: Discussed importance of regular tooth brushing, flossing, and dental visits.    NEXT PREVENTATIVE PHYSICAL DUE IN 1 YEAR. Return in about 6 months (around 11/28/2023) for COPD, HTN/HLD, ANXIETY.

## 2023-05-30 NOTE — Assessment & Plan Note (Signed)
I have recommended complete cessation of tobacco use. I have discussed various options available for assistance with tobacco cessation including over the counter methods (Nicotine gum, patch and lozenges). We also discussed prescription options (Chantix, Nicotine Inhaler / Nasal Spray). The patient is not interested in pursuing any prescription tobacco cessation options at this time.  

## 2023-05-31 LAB — CBC WITH DIFFERENTIAL/PLATELET
Basophils Absolute: 0 10*3/uL (ref 0.0–0.2)
Basos: 1 %
EOS (ABSOLUTE): 0.3 10*3/uL (ref 0.0–0.4)
Eos: 3 %
Hematocrit: 40.4 % (ref 34.0–46.6)
Hemoglobin: 13.3 g/dL (ref 11.1–15.9)
Immature Grans (Abs): 0 10*3/uL (ref 0.0–0.1)
Immature Granulocytes: 0 %
Lymphocytes Absolute: 2.8 10*3/uL (ref 0.7–3.1)
Lymphs: 32 %
MCH: 30.1 pg (ref 26.6–33.0)
MCHC: 32.9 g/dL (ref 31.5–35.7)
MCV: 91 fL (ref 79–97)
Monocytes Absolute: 0.5 10*3/uL (ref 0.1–0.9)
Monocytes: 6 %
Neutrophils Absolute: 5.2 10*3/uL (ref 1.4–7.0)
Neutrophils: 58 %
Platelets: 258 10*3/uL (ref 150–450)
RBC: 4.42 x10E6/uL (ref 3.77–5.28)
RDW: 14 % (ref 11.7–15.4)
WBC: 8.8 10*3/uL (ref 3.4–10.8)

## 2023-05-31 LAB — COMPREHENSIVE METABOLIC PANEL
ALT: 18 [IU]/L (ref 0–32)
AST: 17 [IU]/L (ref 0–40)
Albumin: 3.8 g/dL (ref 3.8–4.8)
Alkaline Phosphatase: 63 [IU]/L (ref 44–121)
BUN/Creatinine Ratio: 18 (ref 12–28)
BUN: 13 mg/dL (ref 8–27)
Bilirubin Total: 0.2 mg/dL (ref 0.0–1.2)
CO2: 22 mmol/L (ref 20–29)
Calcium: 9.2 mg/dL (ref 8.7–10.3)
Chloride: 103 mmol/L (ref 96–106)
Creatinine, Ser: 0.74 mg/dL (ref 0.57–1.00)
Globulin, Total: 2.6 g/dL (ref 1.5–4.5)
Glucose: 101 mg/dL — ABNORMAL HIGH (ref 70–99)
Potassium: 3.7 mmol/L (ref 3.5–5.2)
Sodium: 141 mmol/L (ref 134–144)
Total Protein: 6.4 g/dL (ref 6.0–8.5)
eGFR: 86 mL/min/{1.73_m2} (ref >59)

## 2023-05-31 LAB — LIPID PANEL W/O CHOL/HDL RATIO
Cholesterol, Total: 142 mg/dL (ref 100–199)
HDL: 29 mg/dL — ABNORMAL LOW (ref >39)
LDL Chol Calc (NIH): 40 mg/dL (ref 0–99)
Triglycerides: 513 mg/dL — ABNORMAL HIGH (ref 0–149)
VLDL Cholesterol Cal: 73 mg/dL — ABNORMAL HIGH (ref 5–40)

## 2023-05-31 LAB — VITAMIN D 25 HYDROXY (VIT D DEFICIENCY, FRACTURES): Vit D, 25-Hydroxy: 64 ng/mL (ref 30.0–100.0)

## 2023-05-31 LAB — TSH: TSH: 0.488 u[IU]/mL (ref 0.450–4.500)

## 2023-05-31 LAB — T4, FREE: Free T4: 1.08 ng/dL (ref 0.82–1.77)

## 2023-05-31 NOTE — Progress Notes (Signed)
Good afternoon, please let patient know her labs have returned: - Kidney and liver function normal. - CBC shows no anemia or infection. - Thyroid levels normal, continue current Levothyroxine dosing. - Vitamin D level stable. - Lipid panel shows trend up in triglycerides.  Was she fasting?  If so I may send in some Vascepa to help lower these (like fish oil).  Any questions? Keep being stellar!!  Thank you for allowing me to participate in your care.  I appreciate you. Kindest regards, Bobbi Kozakiewicz

## 2023-11-06 ENCOUNTER — Ambulatory Visit: Admitting: Emergency Medicine

## 2023-11-06 VITALS — Ht <= 58 in | Wt 130.0 lb

## 2023-11-06 DIAGNOSIS — Z0000 Encounter for general adult medical examination without abnormal findings: Secondary | ICD-10-CM | POA: Diagnosis not present

## 2023-11-06 NOTE — Progress Notes (Signed)
 Subjective:   Savannah Whitaker is a 73 y.o. who presents for a Medicare Wellness preventive visit.  Visit Complete: Virtual I connected with  Craige Cotta on 11/06/23 by a audio enabled telemedicine application and verified that I am speaking with the correct person using two identifiers.  Patient Location: Home  Provider Location: Home Office  I discussed the limitations of evaluation and management by telemedicine. The patient expressed understanding and agreed to proceed.  Vital Signs: Because this visit was a virtual/telehealth visit, some criteria may be missing or patient reported. Any vitals not documented were not able to be obtained and vitals that have been documented are patient reported.  VideoDeclined- This patient declined Librarian, academic. Therefore the visit was completed with audio only.  Persons Participating in Visit: Patient.  AWV Questionnaire: No: Patient Medicare AWV questionnaire was not completed prior to this visit.  Cardiac Risk Factors include: advanced age (>67men, >81 women);hypertension;smoking/ tobacco exposure;dyslipidemia     Objective:    Today's Vitals   11/06/23 1349  Weight: 130 lb (59 kg)  Height: 4\' 10"  (1.473 m)   Body mass index is 27.17 kg/m.     11/06/2023    2:00 PM 11/01/2022   11:31 AM 09/30/2021   11:25 AM 09/28/2020   11:16 AM 09/26/2019    8:27 AM 03/29/2018    9:35 AM 07/08/2015    2:06 AM  Advanced Directives  Does Patient Have a Medical Advance Directive? No No No No No No No  Would patient like information on creating a medical advance directive? Yes (MAU/Ambulatory/Procedural Areas - Information given) No - Patient declined No - Patient declined   No - Patient declined Yes - Educational materials given    Current Medications (verified) Outpatient Encounter Medications as of 11/06/2023  Medication Sig   amLODipine (NORVASC) 2.5 MG tablet Take 1 tablet (2.5 mg total) by mouth daily.    Cholecalciferol (VITAMIN D3) 1.25 MG (50000 UT) CAPS TAKE 1 TABLET BY MOUTH ONE TIME PER WEEK   citalopram (CELEXA) 20 MG tablet Take 1 tablet (20 mg total) by mouth daily.   levothyroxine (SYNTHROID) 75 MCG tablet Take 1 tablet (75 mcg total) by mouth daily.   metoprolol succinate (TOPROL-XL) 50 MG 24 hr tablet Take with or immediately following a meal.   rosuvastatin (CRESTOR) 40 MG tablet Take 1 tablet (40 mg total) by mouth daily.   albuterol (VENTOLIN HFA) 108 (90 Base) MCG/ACT inhaler Inhale 2 puffs into the lungs every 6 (six) hours as needed. (Patient not taking: Reported on 11/06/2023)   No facility-administered encounter medications on file as of 11/06/2023.    Allergies (verified) Patient has no known allergies.   History: Past Medical History:  Diagnosis Date   Anxiety    Hyperlipidemia    Hypertension    Hypertriglyceridemia    IFG (impaired fasting glucose)    Osteoarthritis    Thyroid disease    Tobacco use disorder    Vitamin D deficiency    Past Surgical History:  Procedure Laterality Date   CARPAL TUNNEL RELEASE Right    TUBAL LIGATION     Family History  Problem Relation Age of Onset   Lung cancer Mother    Heart disease Father    Colon cancer Brother    Alcohol abuse Brother    Cancer Brother        mouth   Breast cancer Neg Hx    Social History   Socioeconomic History  Marital status: Widowed    Spouse name: Not on file   Number of children: 3   Years of education: Not on file   Highest education level: High school graduate  Occupational History   Occupation: semi retired  Tobacco Use   Smoking status: Every Day    Current packs/day: 1.00    Average packs/day: 1 pack/day for 47.3 years (47.3 ttl pk-yrs)    Types: Cigarettes    Start date: 1978   Smokeless tobacco: Never  Vaping Use   Vaping status: Never Used  Substance and Sexual Activity   Alcohol use: Not Currently    Comment: occasional wine. 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    3 biological children(1 deceased 2 living) and 2 step-children   Social Drivers of Corporate investment banker Strain: Low Risk  (11/06/2023)   Overall Financial Resource Strain (CARDIA)    Difficulty of Paying Living Expenses: Not hard at all  Food Insecurity: No Food Insecurity (11/06/2023)   Hunger Vital Sign    Worried About Running Out of Food in the Last Year: Never true    Ran Out of Food in the Last Year: Never true  Transportation Needs: No Transportation Needs (11/06/2023)   PRAPARE - Administrator, Civil Service (Medical): No    Lack of Transportation (Non-Medical): No  Physical Activity: Inactive (11/06/2023)   Exercise Vital Sign    Days of Exercise per Week: 0 days    Minutes of Exercise per Session: 0 min  Stress: Stress Concern Present (11/06/2023)   [employer] of Occupational Health - Occupational Stress Questionnaire    Feeling of Stress : To some extent  Social Connections: Moderately Isolated (11/06/2023)   Social Connection and Isolation Panel [NHANES]    Frequency of Communication with Friends and Family: More than three times a week    Frequency of Social Gatherings with Friends and Family: More than three times a week    Attends Religious Services: More than 4 times per year    Active Member of [employer] or Organizations: No    Attends Banker Meetings: Never    Marital Status: Widowed    Tobacco Counseling Ready to quit: Not Answered Counseling given: Not Answered    Clinical Intake:  Pre-visit preparation completed: Yes  Pain : No/denies pain     BMI - recorded: 27.17 Nutritional Status: BMI 25 -29 Overweight Nutritional Risks: None Diabetes: No  Lab Results  Component Value Date   HGBA1C 5.9 05/27/2015     How often do you need to have  someone help you when you read instructions, pamphlets, or other written materials from your doctor or pharmacy?: 1 - Never  Interpreter Needed?: No  Information entered by :: Tora Kindred, CMA   Activities of Daily Living     11/06/2023    1:50 PM 05/30/2023    1:16 PM  In your present state of health, do you have any difficulty performing the following activities:  Hearing? 0 0  Vision? 0 0  Difficulty concentrating or making decisions? 0 0  Walking or climbing stairs? 0 0  Dressing or bathing? 0 0  Doing errands, shopping? 0 0  Preparing Food and eating ? N   Using  the Toilet? N   In the past six months, have you accidently leaked urine? Y   Do you have problems with loss of bowel control? N   Managing your Medications? N   Managing your Finances? N   Housekeeping or managing your Housekeeping? N     Patient Care Team: Marjie Skiff, NP as PCP - General (Nurse Practitioner) Antonieta Iba, MD as Consulting Physician (Cardiology)  Indicate any recent Medical Services you may have received from other than [facility] providers in the past year (date may be approximate).     Assessment:   This is a routine wellness examination for Cerissa.  Hearing/Vision screen Hearing Screening - Comments:: Denies hearing loss Vision Screening - Comments:: Needs eye exam. Patient will call to schedule.   Goals Addressed             This Visit's Progress    Patient Stated       Quit smoking and lose weight       Depression Screen     11/06/2023    1:57 PM 05/30/2023    1:10 PM 12/05/2022    2:02 PM 11/01/2022   11:30 AM 05/25/2022    9:08 AM 11/22/2021    8:29 AM 09/30/2021   11:29 AM  PHQ 2/9 Scores  PHQ - 2 Score 2 0 0 0 0 3 1  PHQ- 9 Score 2 0 2 0 0 6 2    Fall Risk     11/06/2023    2:01 PM 05/30/2023    1:10 PM 12/05/2022    2:02 PM 11/01/2022   11:32 AM 05/25/2022    9:07 AM  Fall Risk   Falls in the past year? 0 0 1 0 0  Number falls in past yr: 0 0 1 0 0  Injury  with Fall? 0 0 0 0 0  Risk for fall due to : No Fall Risks No Fall Risks History of fall(s) No Fall Risks No Fall Risks  Follow up Falls prevention discussed;Falls evaluation completed Falls evaluation completed  Falls prevention discussed;Falls evaluation completed Falls evaluation completed    MEDICARE RISK AT HOME:  Medicare Risk at Home Any stairs in or around the home?: Yes If so, are there any without handrails?: No Home free of loose throw rugs in walkways, pet beds, electrical cords, etc?: Yes Adequate lighting in your home to reduce risk of falls?: Yes Life alert?: No Use of a cane, walker or w/c?: No Grab bars in the bathroom?: No Shower chair or bench in shower?: No Elevated toilet seat or a handicapped toilet?: Yes  TIMED UP AND GO:  Was the test performed?  No  Cognitive Function: 6CIT completed        11/06/2023    2:02 PM 11/01/2022   11:38 AM 09/28/2020   11:20 AM 03/29/2018    9:35 AM  6CIT Screen  What Year? 0 points 0 points 0 points 0 points  What month? 0 points 0 points 0 points 0 points  What time? 0 points 0 points 0 points 0 points  Count back from 20 0 points 0 points 0 points 0 points  Months in reverse 0 points 0 points 0 points 0 points  Repeat phrase 0 points 0 points 2 points 0 points  Total Score 0 points 0 points 2 points 0 points    Immunizations Immunization History  Administered Date(s) Administered   Fluad Quad(high Dose 65+) 05/29/2019, 05/24/2021, 05/25/2022   Fluad Trivalent(High  Dose 65+) 05/30/2023   Influenza, High Dose Seasonal PF 07/13/2017, 06/01/2018   Influenza,inj,quad, With Preservative 06/19/2020   Td 06/10/2005    Screening Tests Health Maintenance  Topic Date Due   Zoster Vaccines- Shingrix (1 of 2) Never done   MAMMOGRAM  12/05/2023 (Originally 07/23/2001)   DEXA SCAN  12/05/2023 (Originally 07/23/2016)   DTaP/Tdap/Td (2 - Tdap) 05/27/2024 (Originally 06/11/2015)   Pneumonia Vaccine 74+ Years old (1 of 2 - PCV)  05/29/2024 (Originally 07/23/1957)   Lung Cancer Screening  12/27/2023   INFLUENZA VACCINE  03/01/2024   Medicare Annual Wellness (AWV)  11/05/2024   Fecal DNA (Cologuard)  02/09/2026   Hepatitis C Screening  Completed   HPV VACCINES  Aged Out   COVID-19 Vaccine  Discontinued    Health Maintenance  Health Maintenance Due  Topic Date Due   Zoster Vaccines- Shingrix (1 of 2) Never done   Health Maintenance Items Addressed: See Nurse Notes  Additional Screening:  Vision Screening: Recommended annual ophthalmology exams for early detection of glaucoma and other disorders of the eye.  Dental Screening: Recommended annual dental exams for proper oral hygiene  Community Resource Referral / Chronic Care Management: CRR required this visit?  No   CCM required this visit?  No     Plan:     I have personally reviewed and noted the following in the patient's chart:   Medical and social history Use of alcohol, tobacco or illicit drugs  Current medications and supplements including opioid prescriptions. Patient is not currently taking opioid prescriptions. Functional ability and status Nutritional status Physical activity Advanced directives List of other physicians Hospitalizations, surgeries, and ER visits in previous 12 months Vitals Screenings to include cognitive, depression, and falls Referrals and appointments  In addition, I have reviewed and discussed with patient certain preventive protocols, quality metrics, and best practice recommendations. A written personalized care plan for preventive services as well as general preventive health recommendations were provided to patient.     Tora Kindred, CMA   11/06/2023   After Visit Summary: (Mail) Due to this being a telephonic visit, the after visit summary with patients personalized plan was offered to patient via mail   Notes:  Needs LDCT (order placed 01/02/23) due ~12/27/22. Patient to call and schedule. Declined  pneumonia, covid, shingles and Tdap vaccines. Declined MMG and DEXA Scan.

## 2023-11-06 NOTE — Patient Instructions (Addendum)
 Savannah Whitaker , Thank you for taking time to come for your Medicare Wellness Visit. I appreciate your ongoing commitment to your health goals. Please review the following plan we discussed and let me know if I can assist you in the future.   Referrals/Orders/Follow-Ups/Clinician Recommendations: Call ( [phone]) to schedule your low dose lung CT Scan (due 12/27/23) to screen for lung cancer at your earliest convenience. Recommend scheduling a routine eye exam.  This is a list of the screening recommended for you and due dates:  Health Maintenance  Topic Date Due   Zoster (Shingles) Vaccine (1 of 2) Never done   Mammogram  12/05/2023*   DEXA scan (bone density measurement)  12/05/2023*   DTaP/Tdap/Td vaccine (2 - Tdap) 05/27/2024*   Pneumonia Vaccine (1 of 2 - PCV) 05/29/2024*   Screening for Lung Cancer  12/27/2023   Flu Shot  03/01/2024   Medicare Annual Wellness Visit  11/05/2024   Cologuard (Stool DNA test)  02/09/2026   Hepatitis C Screening  Completed   HPV Vaccine  Aged Out   COVID-19 Vaccine  Discontinued  *Topic was postponed. The date shown is not the original due date.    Advanced directives: (Provided) Advance directive discussed with you today. I have provided a copy for you to complete at home and have notarized. Once this is complete, please bring a copy in to our office so we can scan it into your chart.   Next Medicare Annual Wellness Visit scheduled for next year: Yes, 11/19/24 @ 1:50pm (phone visit)

## 2023-11-26 NOTE — Patient Instructions (Incomplete)
 Voltaren gel  Be Involved in Caring For Your Health:  Taking Medications When medications are taken as directed, they can greatly improve your health. But if they are not taken as prescribed, they may not work. In some cases, not taking them correctly can be harmful. To help ensure your treatment remains effective and safe, understand your medications and how to take them. Bring your medications to each visit for review by your provider.  Your lab results, notes, and after visit summary will be available on My Chart. We strongly encourage you to use this feature. If lab results are abnormal the clinic will contact you with the appropriate steps. If the clinic does not contact you assume the results are satisfactory. You can always view your results on My Chart. If you have questions regarding your health or results, please contact the clinic during office hours. You can also ask questions on My Chart.  We at [facility] are grateful that you chose us  to provide your care. We strive to provide evidence-based and compassionate care and are always looking for feedback. If you get a survey from the clinic please complete this so we can hear your opinions.  Eating Plan for Chronic Obstructive Pulmonary Disease Chronic obstructive pulmonary disease (COPD) causes symptoms such as shortness of breath, coughing, and chest discomfort. These symptoms can make it difficult to eat enough to maintain a healthy weight. Generally, people with COPD should eat a diet that is high in calories, protein, and other nutrients to maintain body weight and to keep the lungs as healthy as possible. Depending on the medicines you take and other health conditions you may have, your health care provider may give you additional recommendations on what to eat or avoid. Talk with your health care provider about your goals for body weight, and work with a dietitian to develop an eating plan that is right for you. What are  tips for following this plan? Reading food labels  Avoid foods with more than 300 milligrams (mg) of salt (sodium) per serving. Choose foods that contain at least 4 grams (g) of fiber per serving. Try to eat 20-30 g of fiber each day. Choose foods that are high in calories and protein, such as nuts, beans, yogurt, and cheese. Shopping Do not buy foods labeled as diet, low-calorie, or low-fat. If you are able to eat dairy products: Avoid low-fat or skim milk. Buy dairy products that have at least 2% fat. Buy nutritional supplement drinks. Buy grains and prepared foods labeled as enriched or fortified. Consider buying low-sodium, pre-made foods to conserve energy for eating. Cooking Add dry milk or protein powder to smoothies. Cook with healthy fats, such as olive oil, canola oil, sunflower oil, and grapeseed oil. Add oil, butter, cream cheese, or nut butters to foods to increase fat and calories. To make foods easier to chew and swallow: Cook vegetables, pasta, and rice until soft. Cut or grind meat into very small pieces. Dip breads in liquid. Meal planning  Eat when you feel hungry. Eat 5-6 small meals throughout the day. Drink 6-8 glasses of water each day. Do not drink liquids with meals. Drink liquids at the end of the meal to avoid feeling full too quickly. Eat a variety of fruits and vegetables every day. Ask for assistance from family or friends with planning and preparing meals as needed. Avoid foods that cause you to feel bloated, such as carbonated drinks, fried foods, beans, broccoli, cabbage, and apples. For older adults,  ask your local agency on aging whether you are eligible for meal assistance programs, such as Meals on Wheels. Lifestyle  Do not smoke. Eat slowly. Take small bites and chew food well before swallowing. Do not overeat. This may make it more difficult to breathe after eating. Sit up while eating. If needed, continue to use supplemental oxygen while  eating. Rest or relax for 30 minutes before and after eating. Monitor your weight as told by your health care provider. Exercise as told by your health care provider. What foods should I eat? Fruits All fresh, dried, canned, or frozen fruits that do not cause gas. Vegetables All fresh, canned (no salt added), or frozen vegetables that do not cause gas. Grains Whole-grain bread. Enriched whole-grain pasta. Fortified whole-grain cereals. Fortified rice. Quinoa. Meats and other proteins Lean meat. Poultry. Fish. Dried beans. Unsalted nuts. Tofu. Eggs. Nut butters. Dairy Whole or 2% milk. Cheese. Yogurt. Fats and oils Olive oil. Canola oil. Butter. Margarine. Beverages Water. Vegetable juice (no salt added). Decaffeinated coffee. Decaffeinated or herbal tea. Seasonings and condiments Fresh or dried herbs. Low-salt or salt-free seasonings. Low-sodium soy sauce. The items listed above may not be a complete list of foods and beverages you can eat. Contact a dietitian for more information. What foods should I avoid? Fruits Fruits that cause gas, such as apples or melon. Vegetables Vegetables that cause gas, such as broccoli, Brussels sprouts, cabbage, cauliflower, and onions. Canned vegetables with added salt. Meats and other proteins Fried meat. Salt-cured meat. Processed meat. Dairy Fat-free or low-fat milk, yogurt, or cheese. Processed cheese. Beverages Carbonated drinks. Caffeinated drinks, such as coffee, tea, and soft drinks. Juice. Alcohol. Vegetable juice with added salt. Seasonings and condiments Salt. Seasoning mixes with salt. Soy sauce. Vanessa General. Other foods Clear soup or broth. Fried foods. Prepared frozen meals. The items listed above may not be a complete list of foods and beverages you should avoid. Contact a dietitian for more information. Summary COPD symptoms can make it difficult to eat enough to maintain a healthy weight. A COPD eating plan can help you maintain  your body weight and keep your lungs as healthy as possible. Eat a diet that is high in calories, protein, and other nutrients. Read labels to make sure that you are getting the right nutrients. Cook foods to make them easier to chew and swallow. Eat 5-6 small meals throughout the day, and avoid foods that cause gas or make you feel bloated. This information is not intended to replace advice given to you by your health care provider. Make sure you discuss any questions you have with your health care provider. Document Revised: 05/26/2023 Document Reviewed: 05/26/2023 Elsevier Patient Education  2024 [employer].

## 2023-11-28 ENCOUNTER — Ambulatory Visit (INDEPENDENT_AMBULATORY_CARE_PROVIDER_SITE_OTHER): Payer: Self-pay | Admitting: Nurse Practitioner

## 2023-11-28 ENCOUNTER — Encounter: Payer: Self-pay | Admitting: Nurse Practitioner

## 2023-11-28 VITALS — BP 124/69 | HR 64 | Temp 98.9°F | Ht <= 58 in | Wt 127.2 lb

## 2023-11-28 DIAGNOSIS — F17219 Nicotine dependence, cigarettes, with unspecified nicotine-induced disorders: Secondary | ICD-10-CM

## 2023-11-28 DIAGNOSIS — E063 Autoimmune thyroiditis: Secondary | ICD-10-CM

## 2023-11-28 DIAGNOSIS — J432 Centrilobular emphysema: Secondary | ICD-10-CM | POA: Diagnosis not present

## 2023-11-28 DIAGNOSIS — I700 Atherosclerosis of aorta: Secondary | ICD-10-CM | POA: Diagnosis not present

## 2023-11-28 DIAGNOSIS — I10 Essential (primary) hypertension: Secondary | ICD-10-CM | POA: Diagnosis not present

## 2023-11-28 DIAGNOSIS — E559 Vitamin D deficiency, unspecified: Secondary | ICD-10-CM

## 2023-11-28 DIAGNOSIS — F419 Anxiety disorder, unspecified: Secondary | ICD-10-CM | POA: Diagnosis not present

## 2023-11-28 DIAGNOSIS — E782 Mixed hyperlipidemia: Secondary | ICD-10-CM

## 2023-11-28 MED ORDER — LEVOTHYROXINE SODIUM 75 MCG PO TABS: 75.0000 ug | ORAL_TABLET | Freq: Every day | ORAL | 4 refills | Status: AC

## 2023-11-28 MED ORDER — AMLODIPINE BESYLATE 2.5 MG PO TABS: 2.5000 mg | ORAL_TABLET | Freq: Every day | ORAL | 4 refills | Status: AC

## 2023-11-28 MED ORDER — CITALOPRAM HYDROBROMIDE 20 MG PO TABS: 20.0000 mg | ORAL_TABLET | Freq: Every day | ORAL | 4 refills | Status: AC

## 2023-11-28 MED ORDER — METOPROLOL SUCCINATE ER 50 MG PO TB24: ORAL_TABLET | ORAL | 4 refills | Status: AC

## 2023-11-28 MED ORDER — ROSUVASTATIN CALCIUM 40 MG PO TABS: 40.0000 mg | ORAL_TABLET | Freq: Every day | ORAL | 3 refills | Status: AC

## 2023-11-28 NOTE — Assessment & Plan Note (Signed)
 Chronic, stable.  BP well below goal in office today. Continue Metoprolol  and Amlodipine  and adjust as needed.  Recommend she monitor BP at least a few mornings a week at home and document.  DASH diet at home.  Labs today: CMP.  Urine ALB 80 May 2024.  Return in 6 months.

## 2023-11-28 NOTE — Assessment & Plan Note (Signed)
 Chronic, ongoing.  Continue Rosuvastatin  and recheck lipid today (fasting).  Adjust as needed.

## 2023-11-28 NOTE — Assessment & Plan Note (Signed)
 Chronic, noted on lung cancer screening imaging.  Continue daily statin and recommend complete cessation of smoking.  Recommend daily Baby ASA 81 MG.  If symptoms present will refer to cardiology.

## 2023-11-28 NOTE — Assessment & Plan Note (Signed)
Chronic, ongoing.  Continue current medication regimen and adjust as needed.  Thyroid labs up to date. 

## 2023-11-28 NOTE — Assessment & Plan Note (Signed)
Ongoing, taking supplement, recheck Vit D today.  Recommend change to daily Vitamin D 3 2000 units.

## 2023-11-28 NOTE — Assessment & Plan Note (Signed)
I have recommended complete cessation of tobacco use. I have discussed various options available for assistance with tobacco cessation including over the counter methods (Nicotine gum, patch and lozenges). We also discussed prescription options (Chantix, Nicotine Inhaler / Nasal Spray). The patient is not interested in pursuing any prescription tobacco cessation options at this time.  Continue annual lung screening. 

## 2023-11-28 NOTE — Addendum Note (Signed)
 Addended by: Chasty Randal T on: 11/28/2023 06:24 PM   Modules accepted: Level of Service

## 2023-11-28 NOTE — Assessment & Plan Note (Signed)
Chronic, stable. Denies SI/HI.  Continue current medication regimen and adjust as needed. Would ultimately like to utilize Wellbutrin to assist with depressive elements & smoking cessation, but at this time is on Metoprolol and will avoid this.  

## 2023-11-28 NOTE — Progress Notes (Signed)
 BP 124/69   Pulse 64   Temp 98.9 F (37.2 C) (Oral)   Ht 4' 9.5" (1.461 m)   Wt 127 lb 3.2 oz (57.7 kg)   LMP  (LMP Unknown)   SpO2 99%   BMI 27.05 kg/m    Subjective:    Patient ID: Savannah Whitaker, female    DOB: 10-Aug-1950, 73 y.o.   MRN: [id number]  HPI: Savannah Whitaker is a 73 y.o. female  Chief Complaint  Patient presents with   Anxiety   COPD   Hyperlipidemia   Hypertension   HAND PAIN Reports has been struggling with carpal tunnel like symptoms to left hand, first 4 fingers with numbness intermittent.  History of carpal tunnel surgery to right hand in past. She is right hand dominant.  Prefers not to see ortho yet Duration: months Involved hand: left Mechanism of injury: unknown Location: medial as above Onset: gradual Severity: 4/10  Quality: dull, aching, and throbbing Frequency: intermittent Radiation: no Aggravating factors:  Alleviating factors:  Treatments attempted:  Relief with NSAIDs?: No NSAIDs Taken Weakness: no Numbness: yes Redness: no Swelling:no Bruising: no Fevers: no   HYPERTENSION / HYPERLIPIDEMIA Taking Rosuvastatin , Amlodipine ,and Metoprolol .  CT imaging lung two-vessel coronary atherosclerosis was noted in past. Satisfied with current treatment? yes Duration of hypertension: chronic BP monitoring frequency: not checking BP range: not checking BP medication side effects: no Duration of hyperlipidemia: chronic Cholesterol medication side effects: no Cholesterol supplements: none Medication compliance: good compliance Aspirin: no Recent stressors: no Recurrent headaches: no Visual changes: no Palpitations: no Dyspnea: no Chest pain: no Lower extremity edema: no Dizzy/lightheaded: no   HYPOTHYROIDISM Taking Levothyroxine  75 MCG daily.  Takes Vitamin D  supplement for low levels. Thyroid  control status:stable Satisfied with current treatment? no Medication side effects: no Medication compliance: good  compliance Etiology of hypothyroidism:  Recent dose adjustment:no Fatigue: no Cold intolerance: no Heat intolerance: no Weight gain: no Weight loss: no Constipation: no Diarrhea/loose stools: no Palpitations: no Lower extremity edema: no Anxiety/depressed mood: no  COPD At present no inhalers.  Smokes 1 PPD, has been smoking since she was 24. Lung screening last performed on 12/27/22, mild emphysema and aortic atherosclerosis ongoing.   COPD status: stable Satisfied with current treatment?: yes Oxygen use: no Dyspnea frequency:  no Cough frequency: occasional hacking with smoking Rescue inhaler frequency: none, refuses Limitation of activity: no Productive cough: none Last Spirometry: May 2024 -- FEV1 54%, FEV1/FVC 66% Pneumovax: Not Up to Date Influenza: Up to Date   ANXIETY Takes Celexa  20 MG daily. Mood status: stable Satisfied with current treatment?: yes Symptom severity: mild  Duration of current treatment : chronic Side effects: no Medication compliance: good compliance Psychotherapy/counseling: none Depressed mood: occasional Anxious mood: no Anhedonia: no Significant weight loss or gain: no Insomnia: occasional, goes to work at 4:30 am, so went gets home she rests Fatigue: no Feelings of worthlessness or guilt: no Impaired concentration/indecisiveness: no Suicidal ideations: no Hopelessness: no Crying spells: yes    11/28/2023    8:05 AM 11/06/2023    1:57 PM 05/30/2023    1:10 PM 12/05/2022    2:02 PM 11/01/2022   11:30 AM  Depression screen PHQ 2/9  Decreased Interest 0 2 0 0 0  Down, Depressed, Hopeless 0 0 0 0 0  PHQ - 2 Score 0 2 0 0 0  Altered sleeping 0 0 0 1 0  Tired, decreased energy 0 0 0 1 0  Change in appetite 0 0 0  0 0  Feeling bad or failure about yourself  0 0 0 0 0  Trouble concentrating 0 0 0 0 0  Moving slowly or fidgety/restless 0 0 0 0 0  Suicidal thoughts 0 0 0 0 0  PHQ-9 Score 0 2 0 2 0  Difficult doing work/chores Not  difficult at all Not difficult at all Not difficult at all Not difficult at all Not difficult at all      11/28/2023    8:05 AM 05/30/2023    1:10 PM 12/05/2022    2:02 PM 05/25/2022    9:08 AM  GAD 7 : Generalized Anxiety Score  Nervous, Anxious, on Edge 0 0 0 0  Control/stop worrying 0 0 0 0  Worry too much - different things 0 0 0 0  Trouble relaxing 0 0 0 0  Restless 0 0 0 0  Easily annoyed or irritable 0 0 0 0  Afraid - awful might happen 0 0 0 0  Total GAD 7 Score 0 0 0 0  Anxiety Difficulty Not difficult at all Not difficult at all Not difficult at all Not difficult at all   Relevant past medical, surgical, family and social history reviewed and updated as indicated. Interim medical history since our last visit reviewed. Allergies and medications reviewed and updated.  Review of Systems  Constitutional:  Negative for activity change, appetite change, diaphoresis, fatigue and fever.  Respiratory:  Negative for cough, chest tightness and shortness of breath.   Cardiovascular:  Negative for chest pain, palpitations and leg swelling.  Gastrointestinal: Negative.   Neurological: Negative.   Psychiatric/Behavioral: Negative.      Per HPI unless specifically indicated above     Objective:    BP 124/69   Pulse 64   Temp 98.9 F (37.2 C) (Oral)   Ht 4' 9.5" (1.461 m)   Wt 127 lb 3.2 oz (57.7 kg)   LMP  (LMP Unknown)   SpO2 99%   BMI 27.05 kg/m   Wt Readings from Last 3 Encounters:  11/28/23 127 lb 3.2 oz (57.7 kg)  11/06/23 130 lb (59 kg)  05/30/23 125 lb 9.6 oz (57 kg)    Physical Exam Vitals and nursing note reviewed.  Constitutional:      General: She is awake. She is not in acute distress.    Appearance: She is well-developed and well-groomed. She is not ill-appearing.  HENT:     Head: Normocephalic.     Right Ear: Hearing normal.     Left Ear: Hearing normal.  Eyes:     General: Lids are normal.        Right eye: No discharge.        Left eye: No  discharge.     Conjunctiva/sclera: Conjunctivae normal.     Pupils: Pupils are equal, round, and reactive to light.  Neck:     Vascular: No carotid bruit.  Cardiovascular:     Rate and Rhythm: Normal rate and regular rhythm.     Heart sounds: Normal heart sounds. No murmur heard.    No gallop.  Pulmonary:     Effort: Pulmonary effort is normal. No accessory muscle usage or respiratory distress.     Breath sounds: Normal breath sounds.  Abdominal:     General: Bowel sounds are normal.     Palpations: Abdomen is soft.  Musculoskeletal:     Cervical back: Normal range of motion and neck supple.     Right lower leg: No edema.  Left lower leg: No edema.  Skin:    General: Skin is warm and dry.  Neurological:     Mental Status: She is alert and oriented to person, place, and time.  Psychiatric:        Attention and Perception: Attention normal.        Mood and Affect: Mood normal.        Speech: Speech normal.        Behavior: Behavior normal. Behavior is cooperative.        Thought Content: Thought content normal.    Results for orders placed or performed in visit on 05/30/23  CBC with Differential/Platelet   Collection Time: 05/30/23  1:29 PM  Result Value Ref Range   WBC 8.8 3.4 - 10.8 x10E3/uL   RBC 4.42 3.77 - 5.28 x10E6/uL   Hemoglobin 13.3 11.1 - 15.9 g/dL   Hematocrit 16.1 09.6 - 46.6 %   MCV 91 79 - 97 fL   MCH 30.1 26.6 - 33.0 pg   MCHC 32.9 31.5 - 35.7 g/dL   RDW 04.5 40.9 - 81.1 %   Platelets 258 150 - 450 x10E3/uL   Neutrophils 58 Not Estab. %   Lymphs 32 Not Estab. %   Monocytes 6 Not Estab. %   Eos 3 Not Estab. %   Basos 1 Not Estab. %   Neutrophils Absolute 5.2 1.4 - 7.0 x10E3/uL   Lymphocytes Absolute 2.8 0.7 - 3.1 x10E3/uL   Monocytes Absolute 0.5 0.1 - 0.9 x10E3/uL   EOS (ABSOLUTE) 0.3 0.0 - 0.4 x10E3/uL   Basophils Absolute 0.0 0.0 - 0.2 x10E3/uL   Immature Granulocytes 0 Not Estab. %   Immature Grans (Abs) 0.0 0.0 - 0.1 x10E3/uL  Comprehensive  metabolic panel   Collection Time: 05/30/23  1:29 PM  Result Value Ref Range   Glucose 101 (H) 70 - 99 mg/dL   BUN 13 8 - 27 mg/dL   Creatinine, Ser 9.14 0.57 - 1.00 mg/dL   eGFR 86 >78 GN/FAO/1.30   BUN/Creatinine Ratio 18 12 - 28   Sodium 141 134 - 144 mmol/L   Potassium 3.7 3.5 - 5.2 mmol/L   Chloride 103 96 - 106 mmol/L   CO2 22 20 - 29 mmol/L   Calcium  9.2 8.7 - 10.3 mg/dL   Total Protein 6.4 6.0 - 8.5 g/dL   Albumin 3.8 3.8 - 4.8 g/dL   Globulin, Total 2.6 1.5 - 4.5 g/dL   Bilirubin Total 0.2 0.0 - 1.2 mg/dL   Alkaline Phosphatase 63 44 - 121 IU/L   AST 17 0 - 40 IU/L   ALT 18 0 - 32 IU/L  Lipid Panel w/o Chol/HDL Ratio   Collection Time: 05/30/23  1:29 PM  Result Value Ref Range   Cholesterol, Total 142 100 - 199 mg/dL   Triglycerides 865 (H) 0 - 149 mg/dL   HDL 29 (L) >78 mg/dL   VLDL Cholesterol Cal 73 (H) 5 - 40 mg/dL   LDL Chol Calc (NIH) 40 0 - 99 mg/dL  TSH   Collection Time: 05/30/23  1:29 PM  Result Value Ref Range   TSH 0.488 0.450 - 4.500 uIU/mL  VITAMIN D  25 Hydroxy (Vit-D Deficiency, Fractures)   Collection Time: 05/30/23  1:29 PM  Result Value Ref Range   Vit D, 25-Hydroxy 64.0 30.0 - 100.0 ng/mL  T4, free   Collection Time: 05/30/23  1:29 PM  Result Value Ref Range   Free T4 1.08 0.82 - 1.77 ng/dL  Assessment & Plan:   Problem List Items Addressed This Visit       Cardiovascular and Mediastinum   Hypertension   Chronic, stable.  BP well below goal in office today. Continue Metoprolol  and Amlodipine  and adjust as needed.  Recommend she monitor BP at least a few mornings a week at home and document.  DASH diet at home.  Labs today: CMP.  Urine ALB 80 May 2024.  Return in 6 months.       Relevant Medications   amLODipine  (NORVASC ) 2.5 MG tablet   metoprolol  succinate (TOPROL -XL) 50 MG 24 hr tablet   rosuvastatin  (CRESTOR ) 40 MG tablet   Other Relevant Orders   Comprehensive metabolic panel with GFR   Aortic atherosclerosis (HCC)    Chronic, noted on lung cancer screening imaging.  Continue daily statin and recommend complete cessation of smoking.  Recommend daily Baby ASA 81 MG.  If symptoms present will refer to cardiology.      Relevant Medications   amLODipine  (NORVASC ) 2.5 MG tablet   metoprolol  succinate (TOPROL -XL) 50 MG 24 hr tablet   rosuvastatin  (CRESTOR ) 40 MG tablet   Other Relevant Orders   Lipid Panel w/o Chol/HDL Ratio   Comprehensive metabolic panel with GFR     Respiratory   Centrilobular emphysema (HCC) - Primary   Chronic, ongoing with no inhalers.  FEV1 54%, FEV1/FVC 66% in May 2024. Recommend complete cessation of smoking and continue yearly CT screening.  Plan on monitoring lung function annually.  Recommended Anoro  initiation but she wishes to hold off.  She will alert provider if wishes to trial.  Discussed with her that emphysema is a progressive disease and there are ways to slow down progression -- quitting smoking and using inhalers. Albuterol  inhaler continues PRN for wheezing or SOB.          Endocrine   Hashimoto's thyroiditis   Chronic, ongoing.  Continue current medication regimen and adjust as needed.  Thyroid  labs up to date.      Relevant Medications   levothyroxine  (SYNTHROID ) 75 MCG tablet   metoprolol  succinate (TOPROL -XL) 50 MG 24 hr tablet     Nervous and Auditory   Nicotine dependence, cigarettes, w unsp disorders   I have recommended complete cessation of tobacco use. I have discussed various options available for assistance with tobacco cessation including over the counter methods (Nicotine gum, patch and lozenges). We also discussed prescription options (Chantix, Nicotine Inhaler / Nasal Spray). The patient is not interested in pursuing any prescription tobacco cessation options at this time.  Continue annual lung screening.          Other   Vitamin D  deficiency   Ongoing, taking supplement, recheck Vit D today.  Recommend change to daily Vitamin D  3 2000 units.       Relevant Orders   VITAMIN D  25 Hydroxy (Vit-D Deficiency, Fractures)   Hyperlipidemia   Chronic, ongoing.  Continue Rosuvastatin  and recheck lipid today (fasting).  Adjust as needed.       Relevant Medications   amLODipine  (NORVASC ) 2.5 MG tablet   metoprolol  succinate (TOPROL -XL) 50 MG 24 hr tablet   rosuvastatin  (CRESTOR ) 40 MG tablet   Other Relevant Orders   Lipid Panel w/o Chol/HDL Ratio   Comprehensive metabolic panel with GFR   Chronic anxiety   Chronic, stable. Denies SI/HI.  Continue current medication regimen and adjust as needed. Would ultimately like to utilize Wellbutrin to assist with depressive elements & smoking cessation, but at this  time is on Metoprolol  and will avoid this.       Relevant Medications   citalopram  (CELEXA ) 20 MG tablet     Follow up plan: Return in about 6 months (around 05/29/2024) for Annual Physical with spirometry after 05/29/24.

## 2023-11-28 NOTE — Assessment & Plan Note (Addendum)
 Chronic, ongoing with no inhalers.  FEV1 54%, FEV1/FVC 66% in May 2024. Recommend complete cessation of smoking and continue yearly CT screening.  Plan on monitoring lung function annually.  Recommended Anoro  initiation but she wishes to hold off.  She will alert provider if wishes to trial.  Discussed with her that emphysema is a progressive disease and there are ways to slow down progression -- quitting smoking and using inhalers. Albuterol  inhaler continues PRN for wheezing or SOB.

## 2023-11-29 LAB — COMPREHENSIVE METABOLIC PANEL WITH GFR
ALT: 20 IU/L (ref 0–32)
AST: 20 IU/L (ref 0–40)
Albumin: 4.3 g/dL (ref 3.8–4.8)
Alkaline Phosphatase: 65 IU/L (ref 44–121)
BUN/Creatinine Ratio: 22 (ref 12–28)
BUN: 18 mg/dL (ref 8–27)
Bilirubin Total: 0.4 mg/dL (ref 0.0–1.2)
CO2: 22 mmol/L (ref 20–29)
Calcium: 9.4 mg/dL (ref 8.7–10.3)
Chloride: 104 mmol/L (ref 96–106)
Creatinine, Ser: 0.83 mg/dL (ref 0.57–1.00)
Globulin, Total: 2.8 g/dL (ref 1.5–4.5)
Glucose: 114 mg/dL — ABNORMAL HIGH (ref 70–99)
Potassium: 4.2 mmol/L (ref 3.5–5.2)
Sodium: 141 mmol/L (ref 134–144)
Total Protein: 7.1 g/dL (ref 6.0–8.5)
eGFR: 75 mL/min/{1.73_m2} (ref >59)

## 2023-11-29 LAB — LIPID PANEL W/O CHOL/HDL RATIO
Cholesterol, Total: 127 mg/dL (ref 100–199)
HDL: 36 mg/dL — ABNORMAL LOW (ref >39)
LDL Chol Calc (NIH): 57 mg/dL (ref 0–99)
Triglycerides: 211 mg/dL — ABNORMAL HIGH (ref 0–149)
VLDL Cholesterol Cal: 34 mg/dL (ref 5–40)

## 2023-11-29 LAB — VITAMIN D 25 HYDROXY (VIT D DEFICIENCY, FRACTURES): Vit D, 25-Hydroxy: 77.7 ng/mL (ref 30.0–100.0)

## 2023-11-29 NOTE — Progress Notes (Signed)
 Please let Shanena know all labs have returned.  Kidney and liver function are normal.  Sugar level a little up, focus heavily on healthy diet.  Vitamin D  level normal, continue supplement. Lipid panel shows LDL at goal, but triglycerides remain a little elevated.  Continue Rosuvastatin  and focus on healthy diet changes at home.  Any questions? Keep being amazing!!  Thank you for allowing me to participate in your care.  I appreciate you. Kindest regards, Seira Cody

## 2023-12-12 DIAGNOSIS — H524 Presbyopia: Secondary | ICD-10-CM | POA: Diagnosis not present

## 2023-12-28 ENCOUNTER — Ambulatory Visit
Admission: RE | Admit: 2023-12-28 | Discharge: 2023-12-28 | Disposition: A | Discharge status: Home / Self Care | Source: Ambulatory Visit | Attending: Nurse Practitioner | Admitting: Nurse Practitioner

## 2023-12-28 DIAGNOSIS — F17210 Nicotine dependence, cigarettes, uncomplicated: Secondary | ICD-10-CM | POA: Insufficient documentation

## 2023-12-28 DIAGNOSIS — Z87891 Personal history of nicotine dependence: Secondary | ICD-10-CM

## 2023-12-28 DIAGNOSIS — Z122 Encounter for screening for malignant neoplasm of respiratory organs: Secondary | ICD-10-CM | POA: Diagnosis not present

## 2024-01-15 ENCOUNTER — Other Ambulatory Visit: Payer: Self-pay | Admitting: Acute Care

## 2024-01-15 DIAGNOSIS — Z122 Encounter for screening for malignant neoplasm of respiratory organs: Secondary | ICD-10-CM

## 2024-01-15 DIAGNOSIS — F17210 Nicotine dependence, cigarettes, uncomplicated: Secondary | ICD-10-CM

## 2024-01-15 DIAGNOSIS — Z87891 Personal history of nicotine dependence: Secondary | ICD-10-CM

## 2024-04-19 ENCOUNTER — Other Ambulatory Visit: Payer: Self-pay | Admitting: Nurse Practitioner

## 2024-04-19 NOTE — Telephone Encounter (Signed)
 Requested medications are due for refill today.  yes  Requested medications are on the active medications list.  yes  Last refill. 02/01/2023 #12 4 rf  Future visit scheduled.   yes  Notes to clinic.  Provider to review at this dosage.    Requested Prescriptions  Pending Prescriptions Disp Refills   Cholecalciferol  (VITAMIN D3) 1.25 MG (50000 UT) CAPS [Pharmacy Med Name: VITAMIN D3 50,000 UNIT CAPSULE] 12 capsule 4    Sig: TAKE 1 TABLET BY MOUTH ONE TIME PER WEEK     Endocrinology:  Vitamins - Vitamin D  Supplementation 2 Failed - 04/19/2024  2:38 PM      Failed - Manual Review: Route requests for 50,000 IU strength to the provider      Passed - Ca in normal range and within 360 days    Calcium   Date Value Ref Range Status  11/28/2023 9.4 8.7 - 10.3 mg/dL Final         Passed - Vitamin D  in normal range and within 360 days    Vit D, 25-Hydroxy  Date Value Ref Range Status  11/28/2023 77.7 30.0 - 100.0 ng/mL Final    Comment:    Vitamin D  deficiency has been defined by the Institute of Medicine and an Endocrine Society practice guideline as a level of serum 25-OH vitamin D  less than 20 ng/mL (1,2). The Endocrine Society went on to further define vitamin D  insufficiency as a level between 21 and 29 ng/mL (2). 1. IOM (Institute of Medicine). 2010. Dietary reference    intakes for calcium  and D. [place]: The    [employer]. 2. Holick MF, Binkley Comstock, Bischoff-Ferrari HA, et al.    Evaluation, treatment, and prevention of vitamin D     deficiency: an Endocrine Society clinical practice    guideline. JCEM. 2011 Jul; 96(7):1911-30.          Passed - Valid encounter within last 12 months    Recent Outpatient Visits           4 months ago Centrilobular emphysema (HCC)   [facility] [place], Melanie DASEN, NP

## 2024-11-19 ENCOUNTER — Ambulatory Visit
